# Patient Record
Sex: Male | Born: 1980
Health system: Southern US, Community
[De-identification: ages and names within clinical notes are randomized; demographics above are authoritative.]

## PROBLEM LIST (undated history)

## (undated) DIAGNOSIS — E162 Hypoglycemia, unspecified: Secondary | ICD-10-CM

## (undated) DIAGNOSIS — J45909 Unspecified asthma, uncomplicated: Secondary | ICD-10-CM

## (undated) HISTORY — DX: Unspecified asthma, uncomplicated: J45.909

## (undated) HISTORY — PX: TONSILLECTOMY AND ADENOIDECTOMY: SUR1326

## (undated) HISTORY — PX: WISDOM TOOTH EXTRACTION: SHX21

---

## 2003-10-02 ENCOUNTER — Other Ambulatory Visit: Payer: Self-pay

## 2005-07-27 ENCOUNTER — Ambulatory Visit: Payer: Self-pay | Admitting: Endocrinology

## 2005-07-28 ENCOUNTER — Emergency Department: Payer: Self-pay | Admitting: Emergency Medicine

## 2005-07-29 ENCOUNTER — Ambulatory Visit: Payer: Self-pay | Admitting: Endocrinology

## 2005-07-29 ENCOUNTER — Emergency Department: Payer: Self-pay | Admitting: Internal Medicine

## 2005-07-30 ENCOUNTER — Ambulatory Visit: Payer: Self-pay | Admitting: Emergency Medicine

## 2005-08-05 ENCOUNTER — Emergency Department: Payer: Self-pay | Admitting: Emergency Medicine

## 2005-08-05 ENCOUNTER — Other Ambulatory Visit: Payer: Self-pay

## 2005-08-11 ENCOUNTER — Ambulatory Visit: Payer: Self-pay | Admitting: General Surgery

## 2005-08-21 ENCOUNTER — Ambulatory Visit: Payer: Self-pay | Admitting: Gastroenterology

## 2005-08-26 ENCOUNTER — Ambulatory Visit: Payer: Self-pay | Admitting: Gastroenterology

## 2007-12-12 IMAGING — CR DG CHEST 2V
1 series · 2 of 2 positions shown · non-contrast
Comparison: none

REASON FOR EXAM: rib pain for 2 months
COMMENTS:

PROCEDURE:     DXR - DXR CHEST PA (OR AP) AND LATERAL  - July 27, 2005  [DATE]
RESULT:     The lung fields are clear. No pneumonia, pneumothorax or pleural
effusion is seen. The heart, mediastinal and osseous structures show no
significant abnormalities.

[Series 1: view not recorded · 0.17mm/px · 2 of 2 slices shown]
[im 1/2]
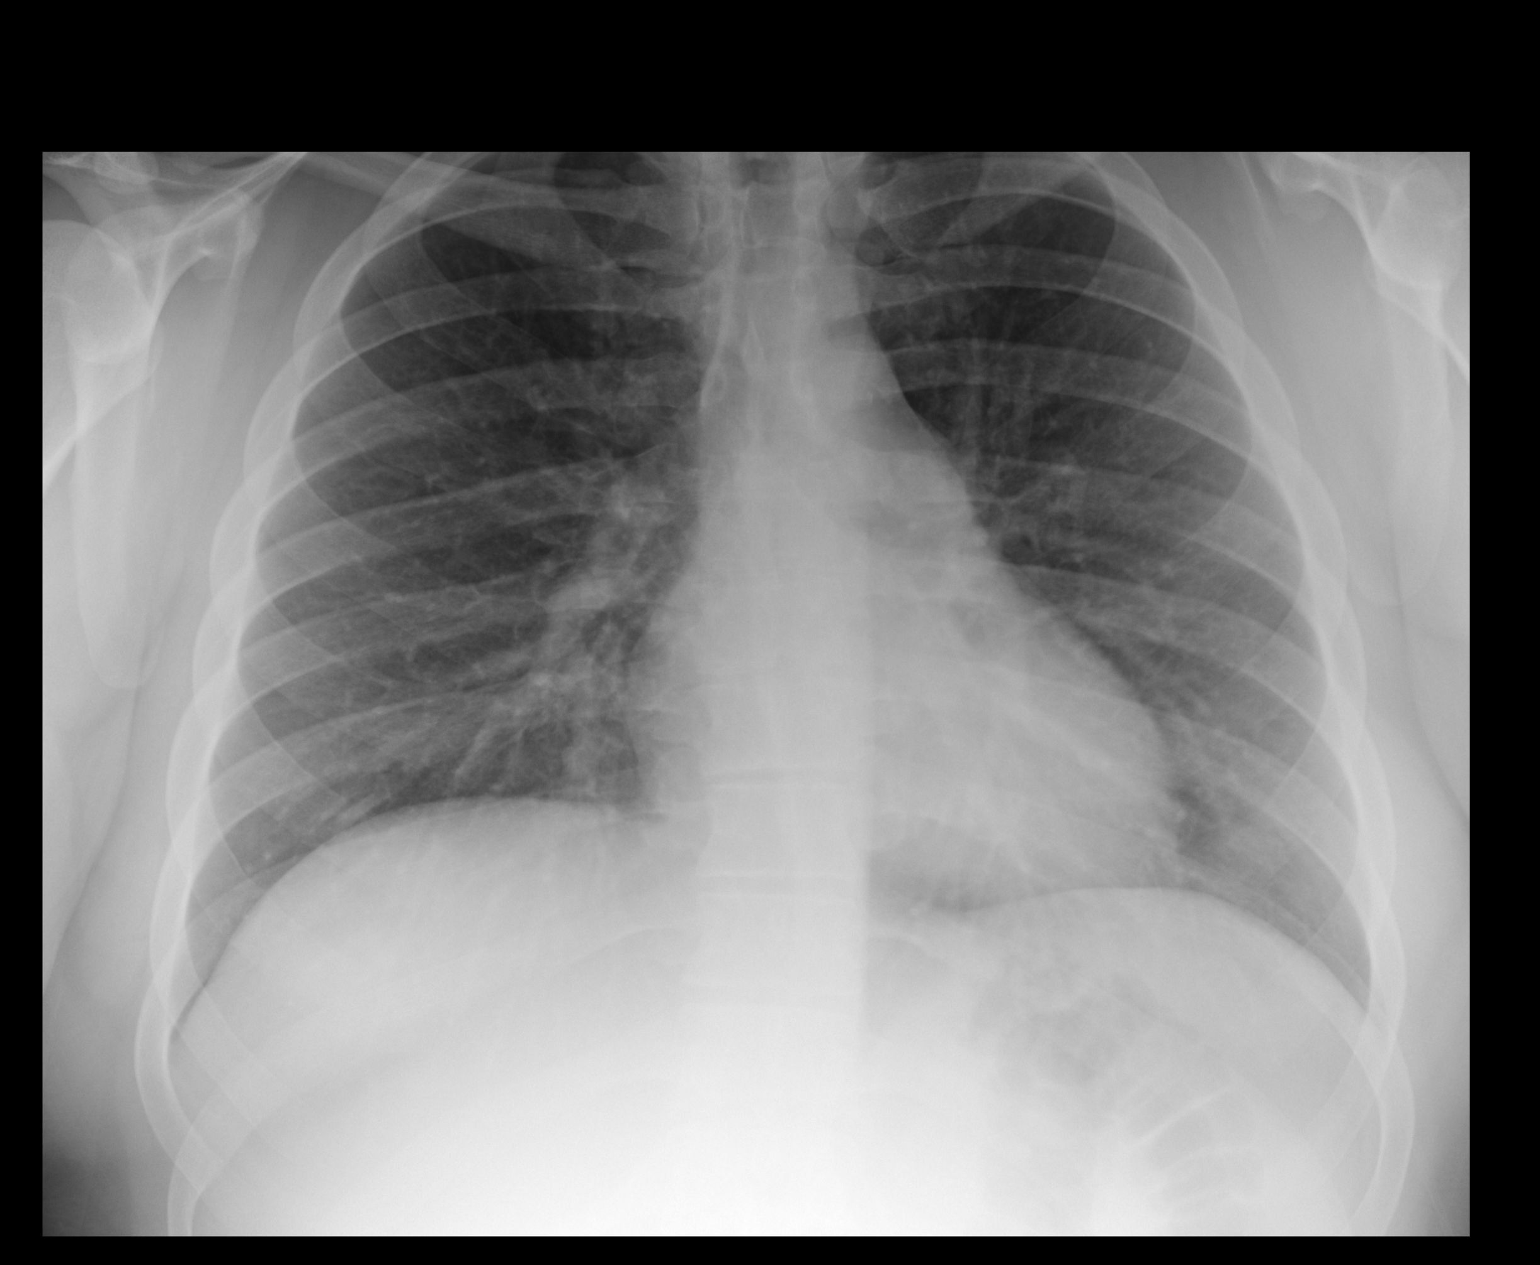
[im 2/2]
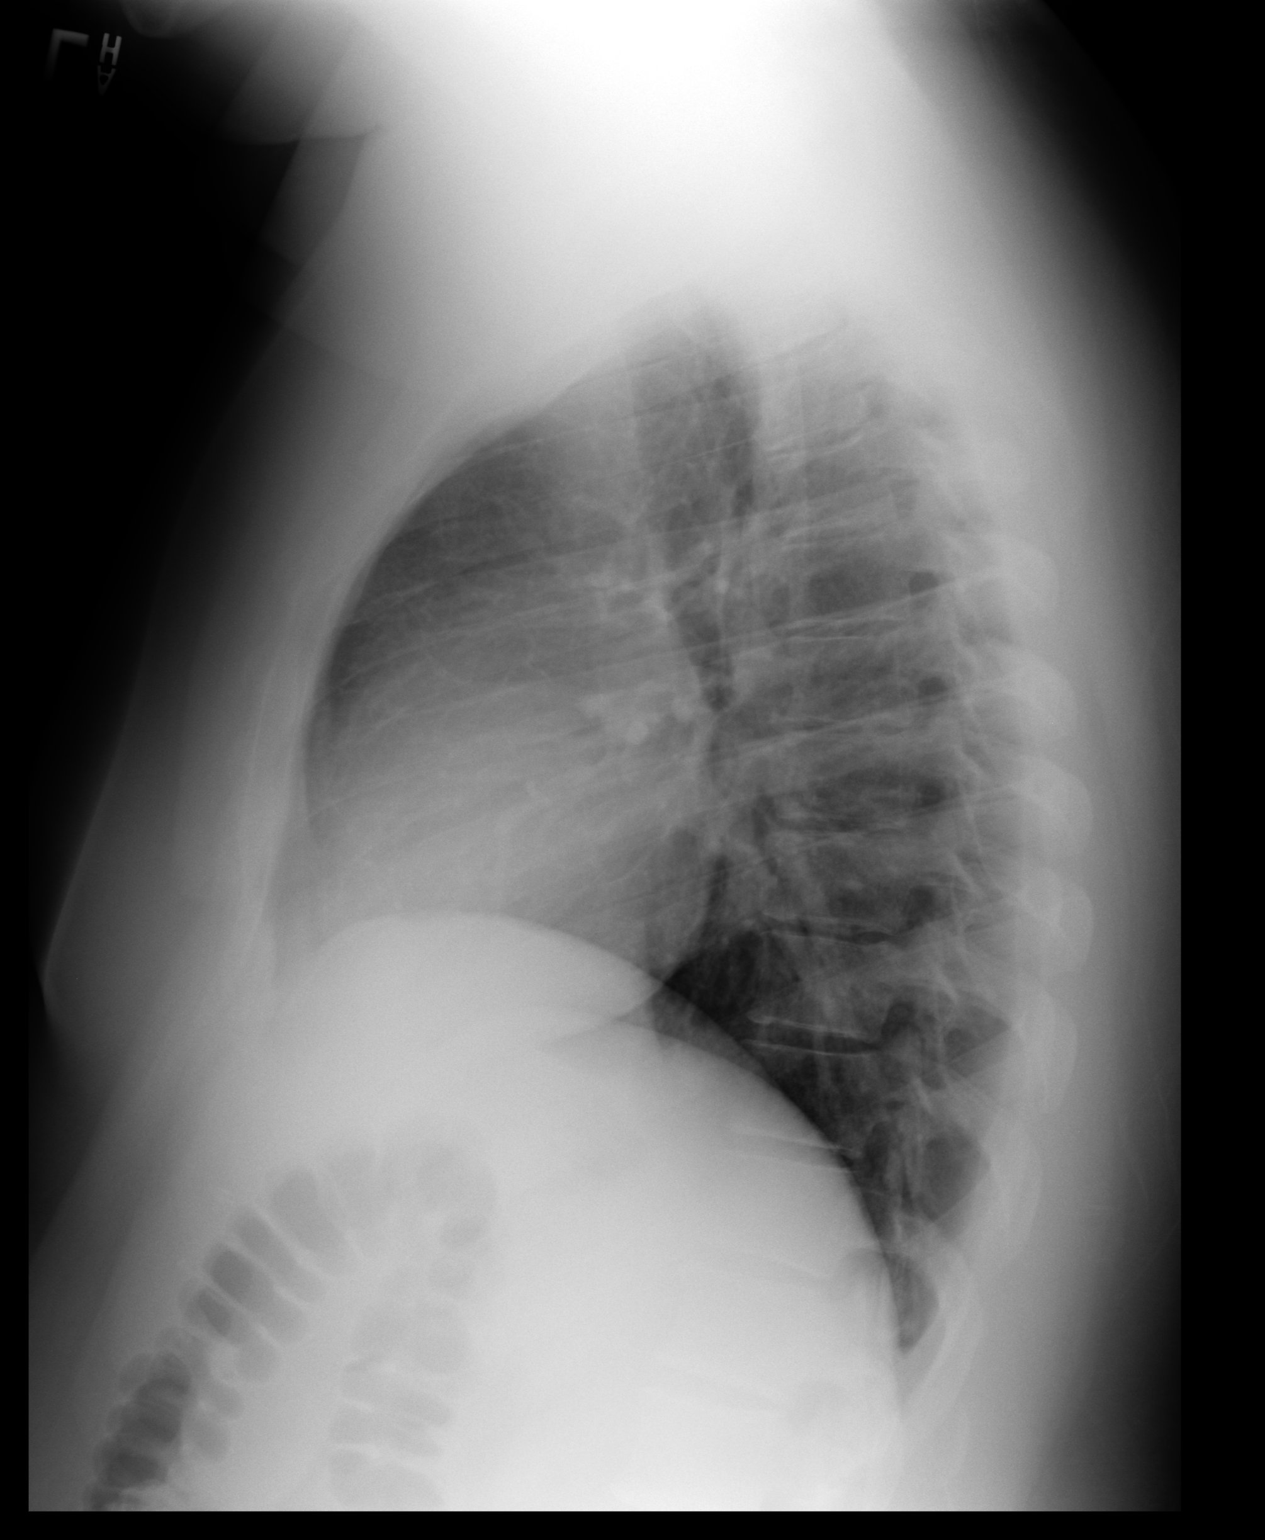

[2 of 2 positions shown; findings below may reference images not displayed]

IMPRESSION: 1)No significant abnormalities are noted.

## 2007-12-14 IMAGING — US ABDOMEN ULTRASOUND
1 series · 14 of 25 positions shown · non-contrast
Comparison: none

REASON FOR EXAM: RUQ pain  Call Report to  [phone]
COMMENTS:

[Series 1: abdomen ultrasound · 0.35mm/px · 14 of 57 slices shown]
[im 1/57]
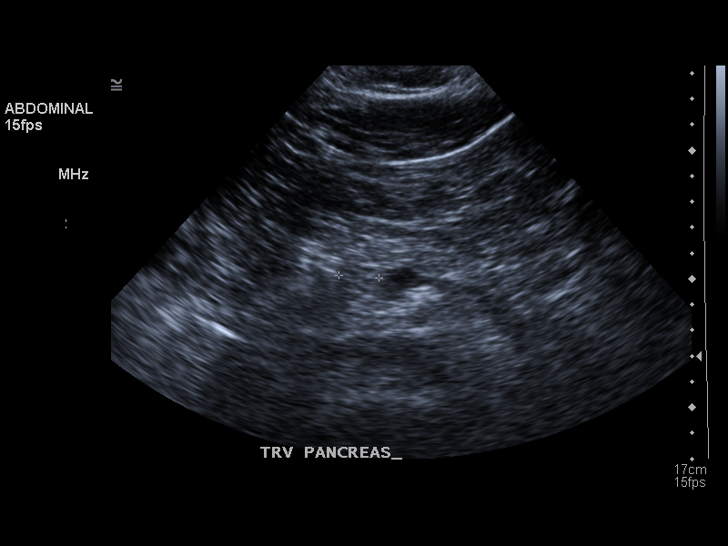
[im 5/57]
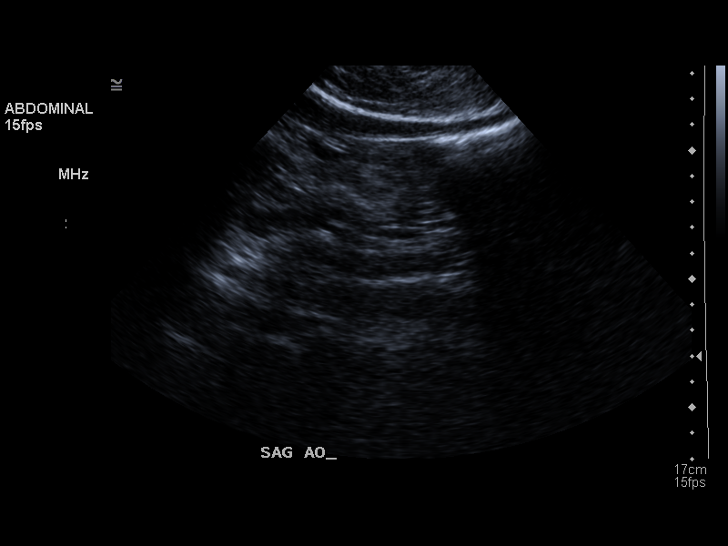
[im 10/57]
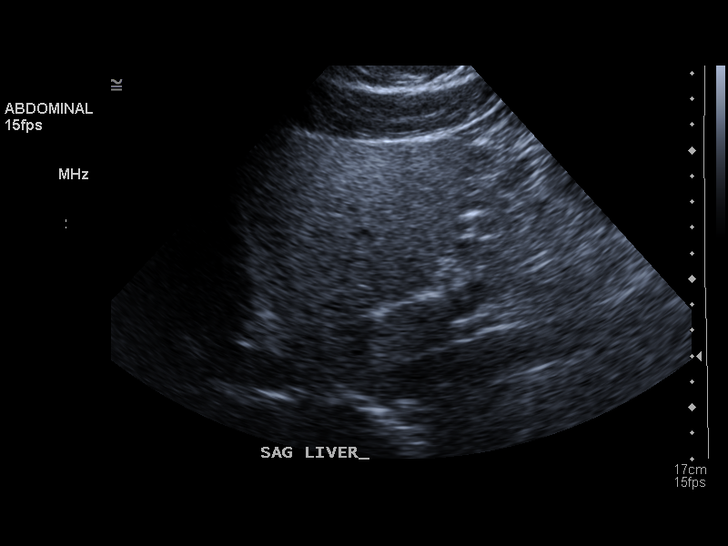
[im 15/57]
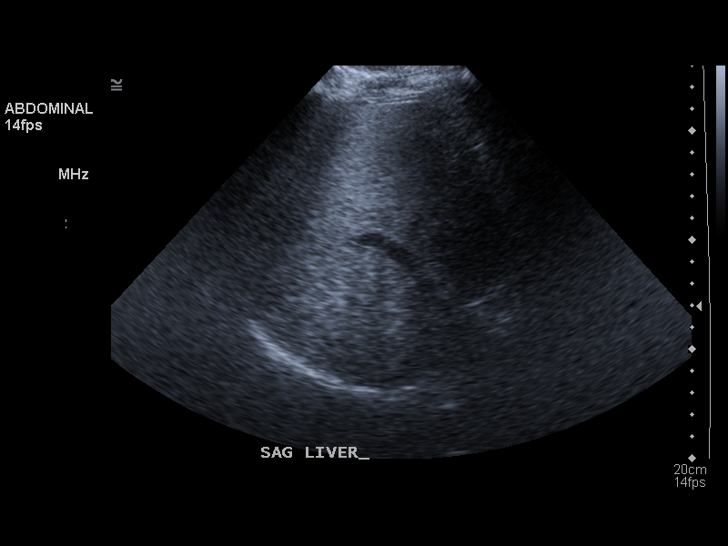
[im 19/57]
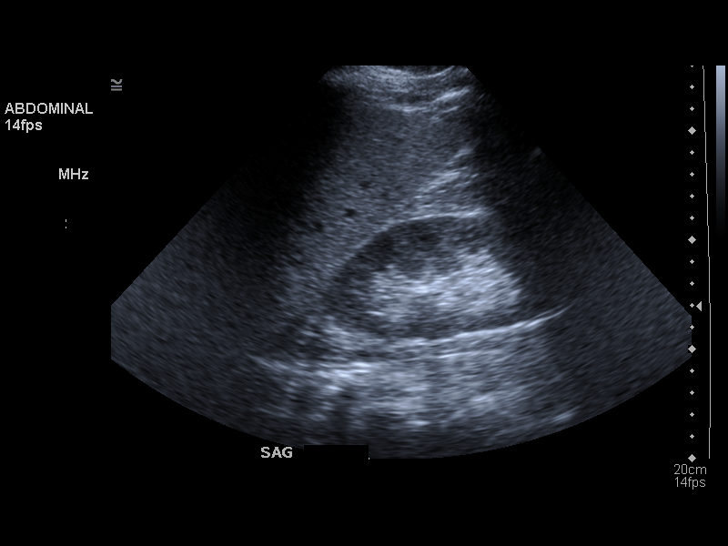
[im 22/57]
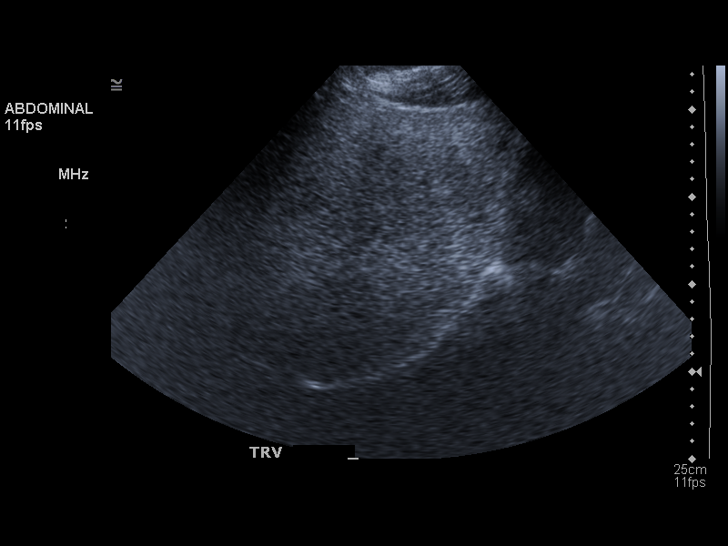
[im 26/57]
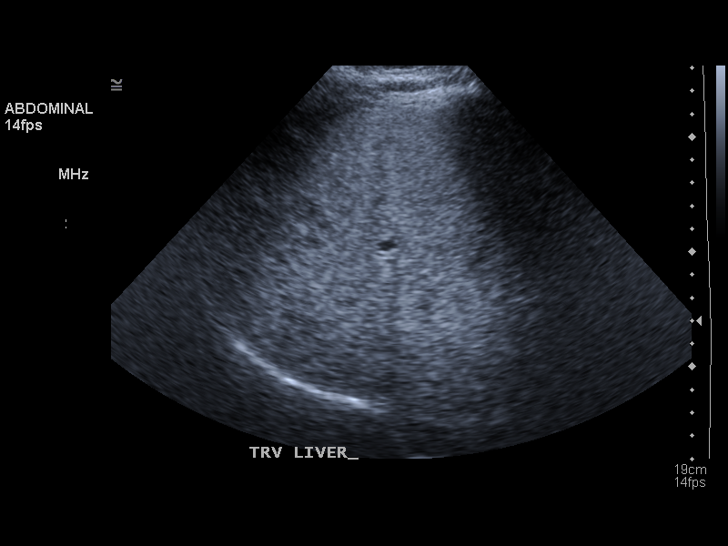
[im 31/57]
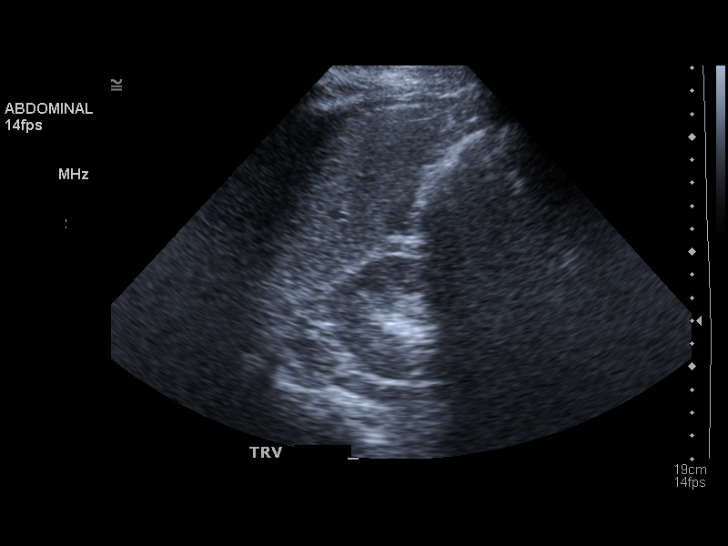
[im 36/57]
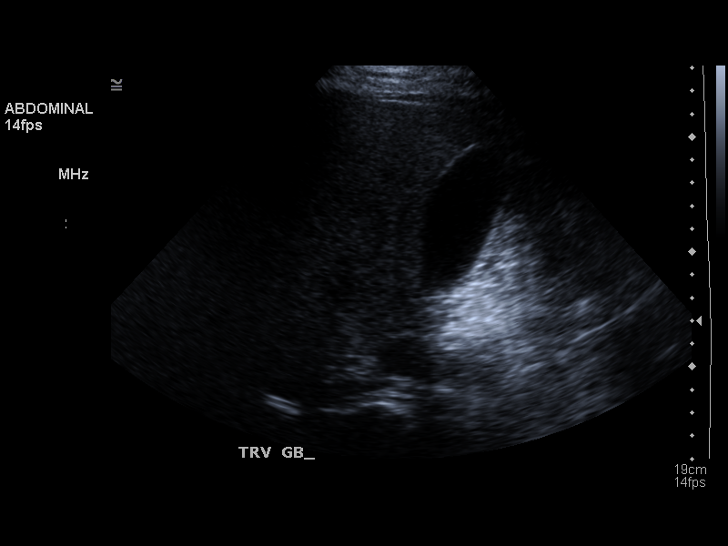
[im 38/57]
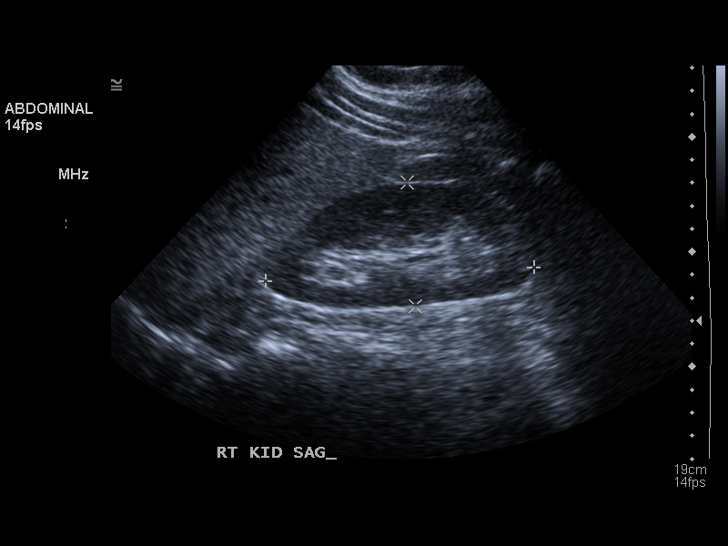
[im 43/57]
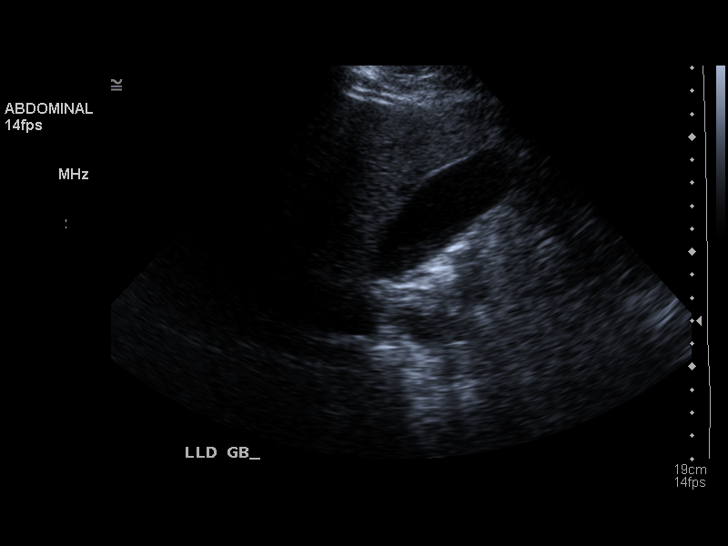
[im 47/57]
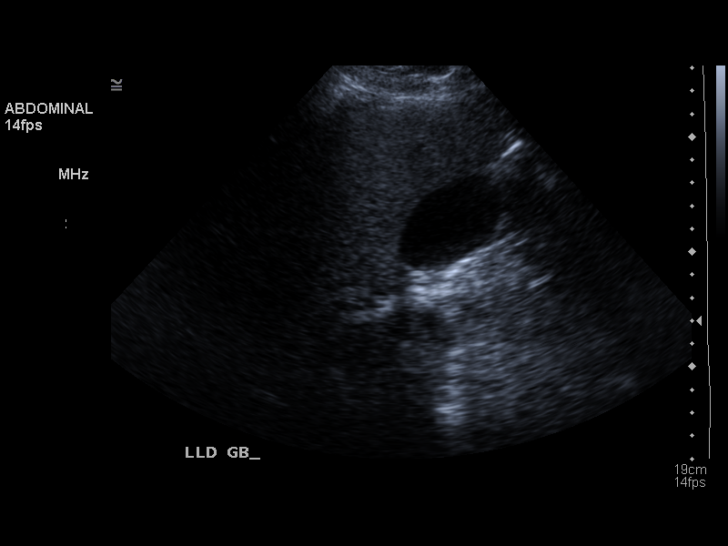
[im 52/57]
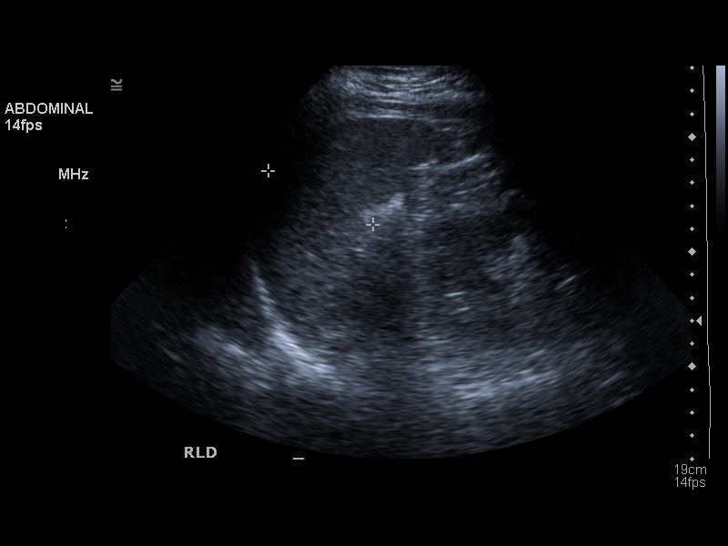
[im 57/57]
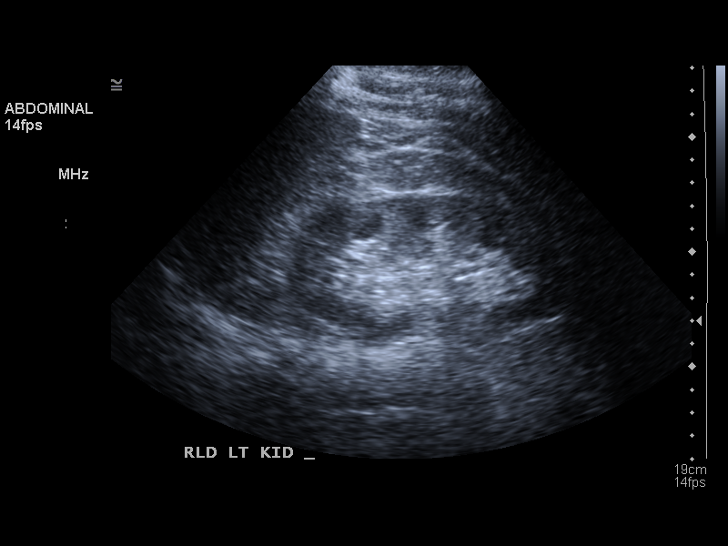

[14 of 25 positions shown; findings below may reference images not displayed]

PROCEDURE:     US  - US ABDOMEN GENERAL SURVEY  - July 29, 2005  [DATE]

RESULT:     The liver, spleen and pancreas show no specific abnormalities.
A note is made that the tail of the pancreas is not well seen on this exam
and is partially obscured by bowel gas.  No gallstones are seen.  There is
no thickening of the gallbladder wall.  Common bile duct measures 2.8 mm in
diameter which is within normal limits.  The kidneys show no hydronephrosis.
 There is no ascites.
IMPRESSION: No significant abnormalities are noted.

The pancreatic tail is not optimally seen on this exam.

## 2007-12-14 IMAGING — CT CT ABD-PELV W/ CM
1 of 2 series · 15 of 32 positions shown, 19 images · non-contrast
Comparison: none

REASON FOR EXAM: (1) abd pain, po and iv contrast//RM7; (2) flank pain, po
and iv contrast
COMMENTS:

[Series 2: abdomen · axial · 0.65mm/px · z∈[-993,-545]mm · 15 of 62 slices shown, 19 images]
[im 3/62  soft-tissue]
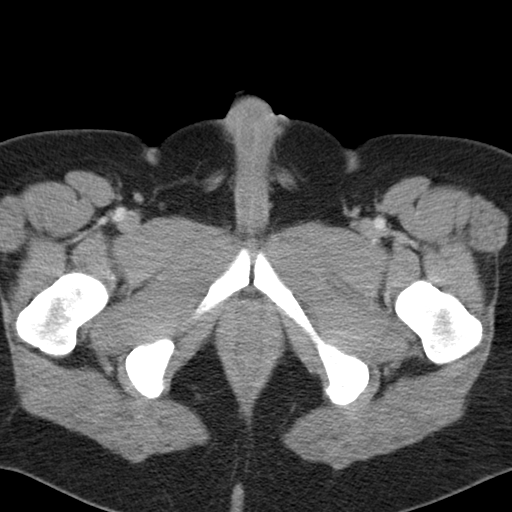
[im 3/62  bone]
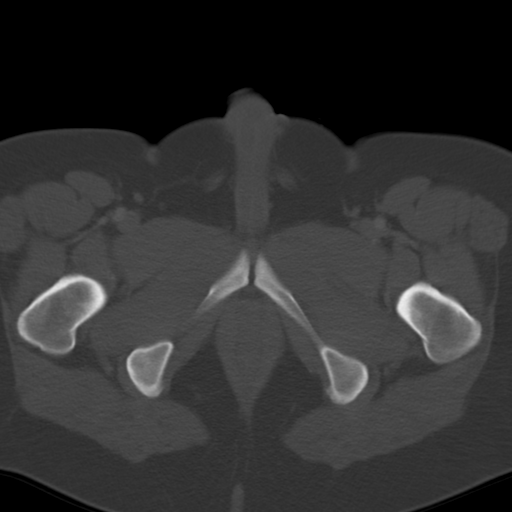
[im 8/62  soft-tissue]
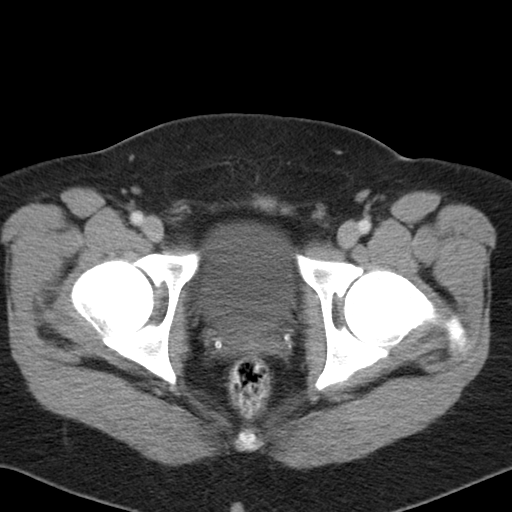
[im 14/62  soft-tissue]
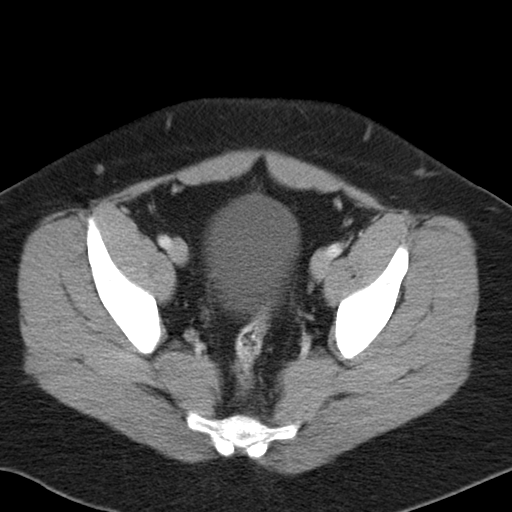
[im 16/62  soft-tissue]
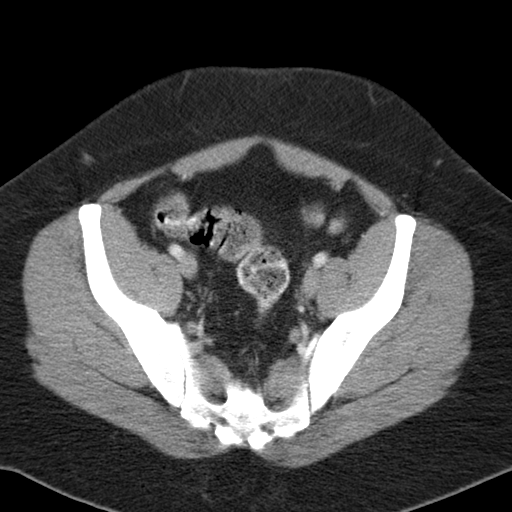
[im 22/62  soft-tissue]
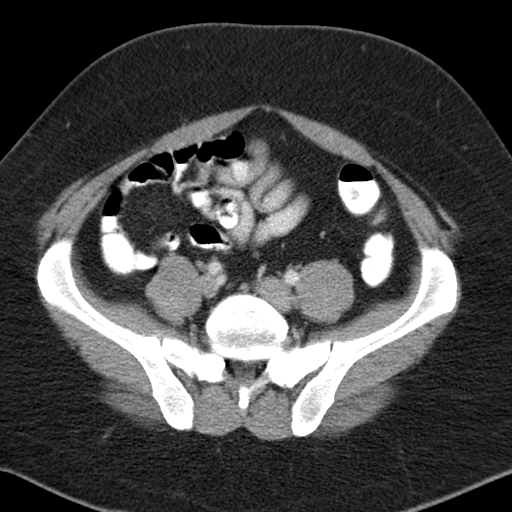
[im 27/62  soft-tissue]
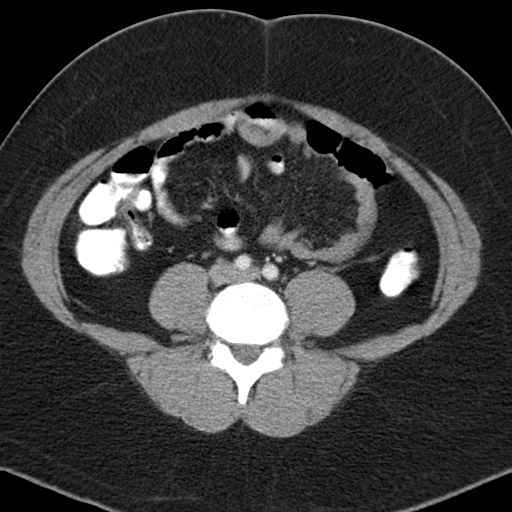
[im 32/62  soft-tissue]
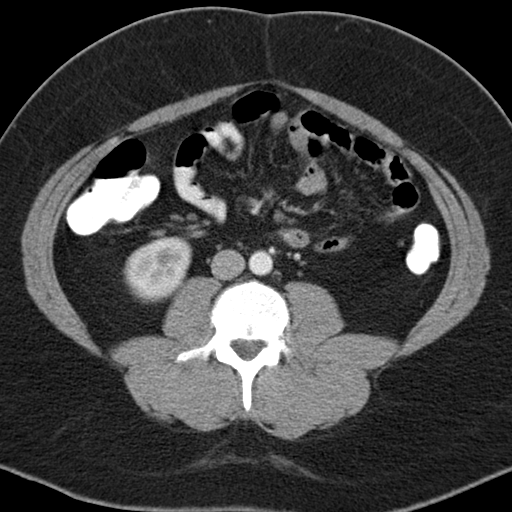
[im 35/62  soft-tissue]
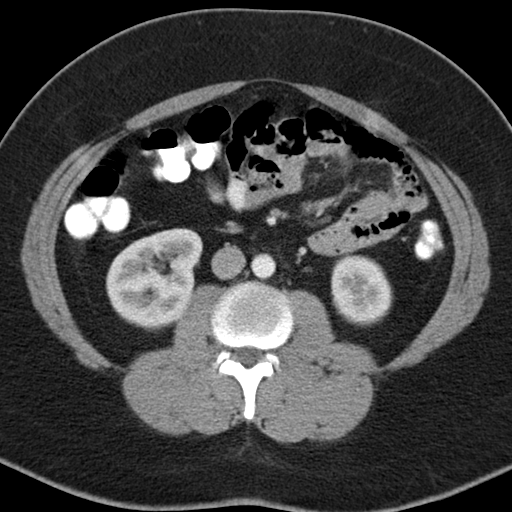
[im 40/62  soft-tissue]
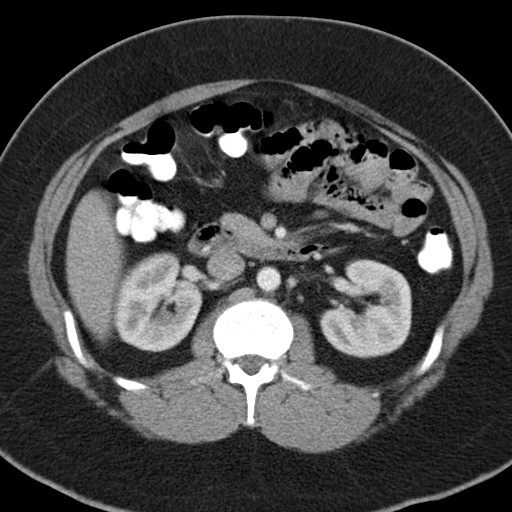
[im 40/62  bone]
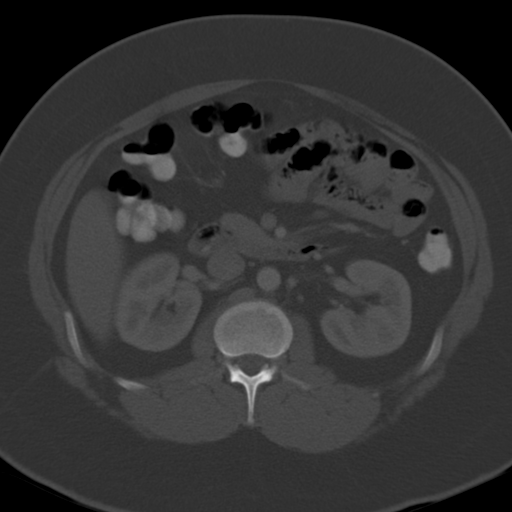
[im 46/62  soft-tissue]
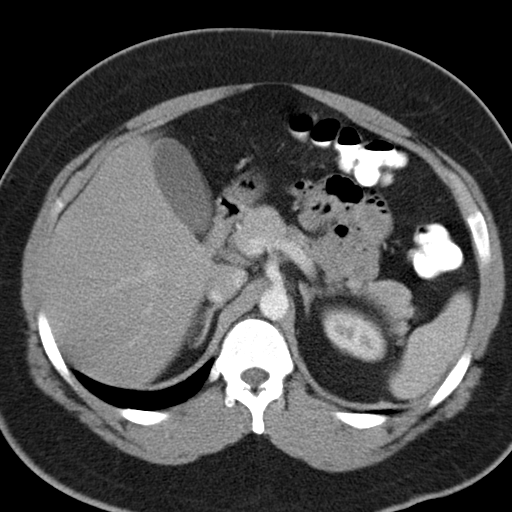
[im 48/62  soft-tissue]
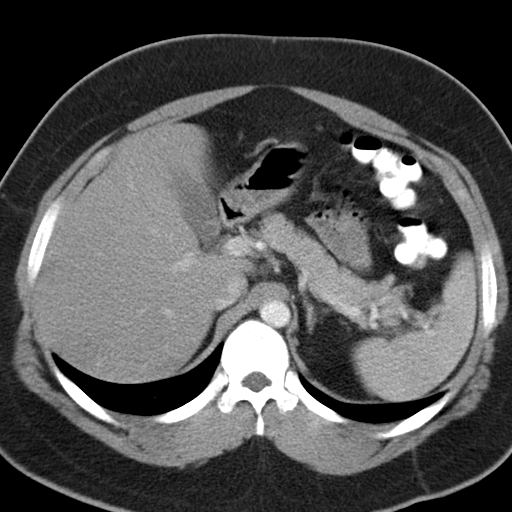
[im 51/62  lung]
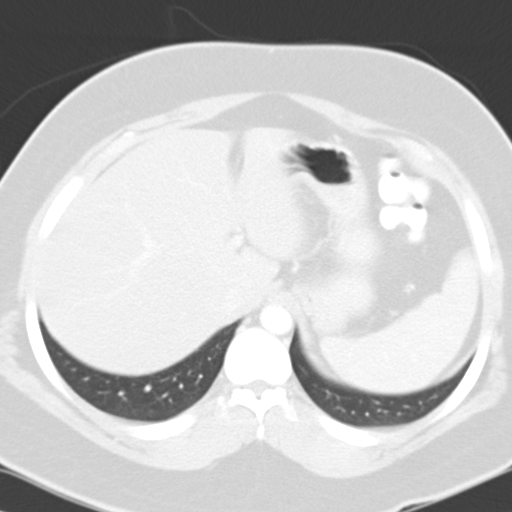
[im 54/62  soft-tissue]
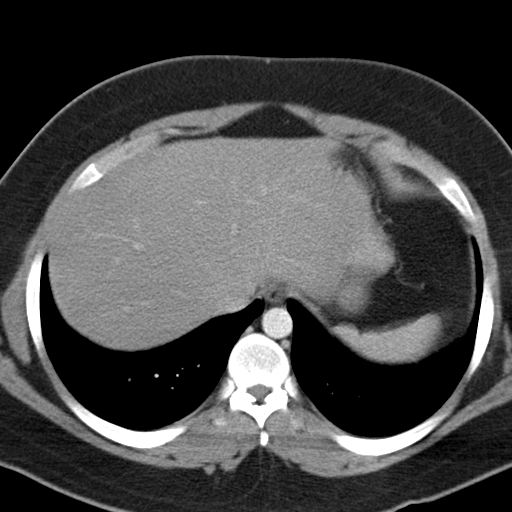
[im 54/62  lung]
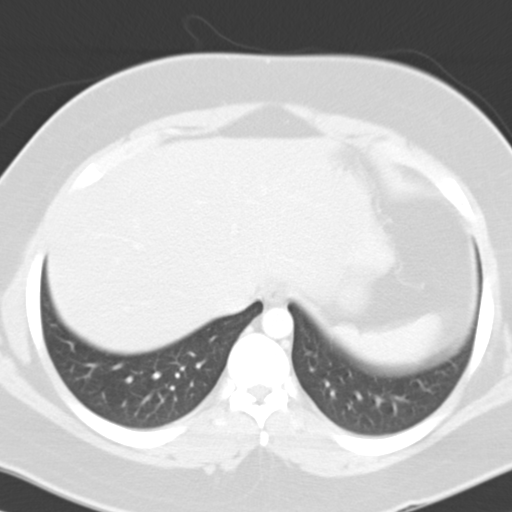
[im 56/62  lung]
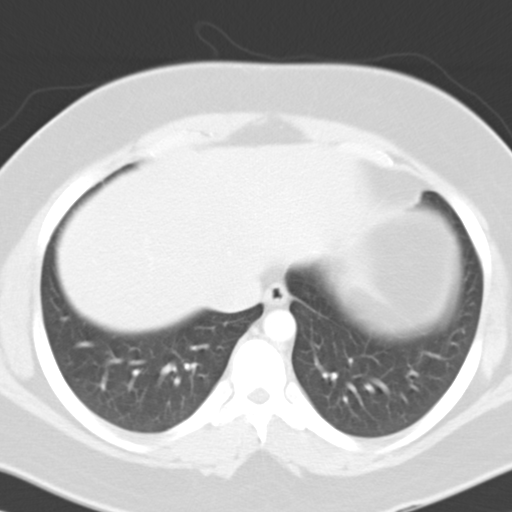
[im 59/62  soft-tissue]
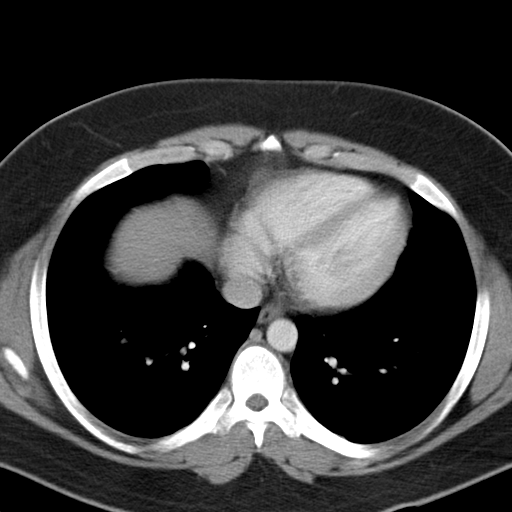
[im 59/62  lung]
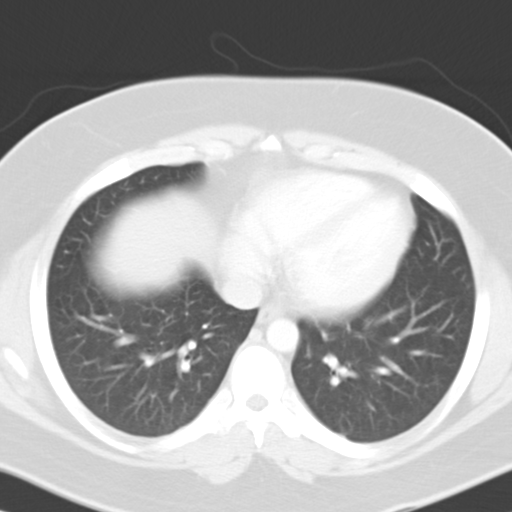

[15 of 32 positions shown; findings below may reference images not displayed]

PROCEDURE:     CT  - CT ABDOMEN / PELVIS  W  - July 29, 2005  [DATE]

RESULT:     Spiral, 8 mm segments were obtained from the lung bases through
the pubic symphysis.  Status post intravenous administration of 100 ml of
Isovue 370 contrast and oral contrast.

Evaluation of the lung bases demonstrates no gross abnormalities.

The liver, spleen, adrenals, pancreas, and kidneys are unremarkable.  There
does not appear to be calcifications within the gallbladder to suggest
calcified gallstones.  No evidence of abdominal or pelvic free fluid,
drainable loculated fluid collections, masses, or adenopathy.  The appendix
is not clearly identified and there does not appear to be CT evidence in the
RIGHT pericolic gutter regions to suggest sequela of appendicitis.  The
celiac, SMA, IMA, and portal vein are patent.
IMPRESSION: 1.     Unremarkable CT of the abdomen and pelvis, as described above.
2.     Dr. Opria of the emergency department was informed of these
findings at the time of the initial interpretation.

## 2008-11-24 ENCOUNTER — Emergency Department: Payer: Self-pay | Admitting: Emergency Medicine

## 2008-12-03 ENCOUNTER — Emergency Department: Payer: Self-pay | Admitting: Internal Medicine

## 2009-08-30 DIAGNOSIS — J45909 Unspecified asthma, uncomplicated: Secondary | ICD-10-CM | POA: Insufficient documentation

## 2009-08-31 ENCOUNTER — Inpatient Hospital Stay: Payer: Self-pay | Admitting: Internal Medicine

## 2009-09-10 ENCOUNTER — Emergency Department: Payer: Self-pay | Admitting: Emergency Medicine

## 2010-11-26 ENCOUNTER — Emergency Department: Payer: Self-pay | Admitting: Emergency Medicine

## 2010-11-27 ENCOUNTER — Emergency Department: Payer: Self-pay | Admitting: Emergency Medicine

## 2010-11-28 ENCOUNTER — Ambulatory Visit: Payer: Self-pay

## 2012-12-12 ENCOUNTER — Other Ambulatory Visit: Payer: Self-pay | Admitting: Orthopedic Surgery

## 2012-12-12 DIAGNOSIS — M25552 Pain in left hip: Secondary | ICD-10-CM

## 2012-12-26 ENCOUNTER — Ambulatory Visit
Admission: RE | Admit: 2012-12-26 | Discharge: 2012-12-26 | Disposition: A | Discharge status: Home / Self Care | Payer: Worker's Compensation | Source: Ambulatory Visit | Attending: Orthopedic Surgery | Admitting: Orthopedic Surgery

## 2012-12-26 DIAGNOSIS — M25552 Pain in left hip: Secondary | ICD-10-CM

## 2012-12-26 MED ORDER — IOHEXOL 180 MG/ML  SOLN: 1.0000 mL | Freq: Once | INTRAMUSCULAR | Status: AC | PRN

## 2014-10-02 ENCOUNTER — Encounter: Payer: Self-pay | Admitting: Family Medicine

## 2014-10-02 ENCOUNTER — Ambulatory Visit (INDEPENDENT_AMBULATORY_CARE_PROVIDER_SITE_OTHER): Payer: BLUE CROSS/BLUE SHIELD | Admitting: Family Medicine

## 2014-10-02 VITALS — BP 118/92 | HR 98 | Temp 99.0°F | Resp 16 | Wt 320.2 lb

## 2014-10-02 DIAGNOSIS — G43109 Migraine with aura, not intractable, without status migrainosus: Secondary | ICD-10-CM | POA: Insufficient documentation

## 2014-10-02 DIAGNOSIS — J019 Acute sinusitis, unspecified: Secondary | ICD-10-CM

## 2014-10-02 DIAGNOSIS — E669 Obesity, unspecified: Secondary | ICD-10-CM | POA: Insufficient documentation

## 2014-10-02 DIAGNOSIS — J309 Allergic rhinitis, unspecified: Secondary | ICD-10-CM | POA: Insufficient documentation

## 2014-10-02 MED ORDER — PREDNISONE 10 MG PO TABS: ORAL_TABLET | ORAL | Status: DC

## 2014-10-02 MED ORDER — DOXYCYCLINE HYCLATE 100 MG PO TABS: 100.0000 mg | ORAL_TABLET | Freq: Two times a day (BID) | ORAL | Status: DC

## 2014-10-02 NOTE — Progress Notes (Signed)
Subjective:     Patient ID: Benjamin Adams, male   DOB: 15-Apr-1980, 34 y.o.   MRN: [id number]  HPI  Chief Complaint  Patient presents with  . Facial Pain    Patient comes in office today with concerns of sinus pain and pressure intermittent over a period of 1-2 weeks. Patient states that pain is mostly located on right side of face, sharp pain starts at right ear radiating down to jaw. Patient descibes sinus pressure as being located above eyes. Associated symptoms include congestion and fatigue, patient has been uring otc Claritin and Tylenol for relief.   States has been using Claritin D with mild improvement. Does not use steroid nasal spray as it upsets his stomach. Accompanied by his wife.  Review of Systems  Constitutional: Negative for fever and chills.  HENT:       No hx of sinus surgery       Objective:   Physical Exam  Constitutional: He appears well-developed and well-nourished. He has a sickly appearance. No distress.  Ears: T.M's intact without inflammation Sinuses: Frontal, maxillary, and paranasal sinuses all mildly tender. Throat: no erythema, tonsils absent. Neck: no cervical adenopathy Lungs: clear     Assessment:    1. Acute sinusitis, recurrence not specified, unspecified location - doxycycline (VIBRA-TABS) 100 MG tablet; Take 1 tablet (100 mg total) by mouth 2 (two) times daily.  Dispense: 20 tablet; Refill: 0 - predniSONE (DELTASONE) 10 MG tablet; Taper daily as follows: 6 pills, 5, 4, 3, 2, 1  Dispense: 21 tablet; Refill: 0    Plan:   Continue Claritin D

## 2014-10-02 NOTE — Patient Instructions (Signed)
Continue Claritin-D.

## 2014-12-19 ENCOUNTER — Emergency Department: Payer: BLUE CROSS/BLUE SHIELD

## 2014-12-19 ENCOUNTER — Emergency Department
Admission: EM | Admit: 2014-12-19 | Discharge: 2014-12-19 | Disposition: A | Discharge status: Home / Self Care | Payer: BLUE CROSS/BLUE SHIELD | Attending: Emergency Medicine | Admitting: Emergency Medicine

## 2014-12-19 ENCOUNTER — Other Ambulatory Visit: Payer: Self-pay

## 2014-12-19 ENCOUNTER — Encounter: Payer: Self-pay | Admitting: Emergency Medicine

## 2014-12-19 DIAGNOSIS — M25512 Pain in left shoulder: Secondary | ICD-10-CM | POA: Diagnosis not present

## 2014-12-19 DIAGNOSIS — M546 Pain in thoracic spine: Secondary | ICD-10-CM | POA: Diagnosis not present

## 2014-12-19 DIAGNOSIS — M25511 Pain in right shoulder: Secondary | ICD-10-CM | POA: Diagnosis not present

## 2014-12-19 DIAGNOSIS — J45901 Unspecified asthma with (acute) exacerbation: Secondary | ICD-10-CM | POA: Diagnosis not present

## 2014-12-19 DIAGNOSIS — Z880 Allergy status to penicillin: Secondary | ICD-10-CM | POA: Diagnosis not present

## 2014-12-19 DIAGNOSIS — R0789 Other chest pain: Secondary | ICD-10-CM | POA: Insufficient documentation

## 2014-12-19 DIAGNOSIS — Z79899 Other long term (current) drug therapy: Secondary | ICD-10-CM | POA: Insufficient documentation

## 2014-12-19 LAB — CBC
HCT: 45.7 % (ref 40.0–52.0)
Hemoglobin: 15.2 g/dL (ref 13.0–18.0)
MCH: 31 pg (ref 26.0–34.0)
MCHC: 33.2 g/dL (ref 32.0–36.0)
MCV: 93.5 fL (ref 80.0–100.0)
PLATELETS: 244 10*3/uL (ref 150–440)
RBC: 4.89 MIL/uL (ref 4.40–5.90)
RDW: 13.9 % (ref 11.5–14.5)
WBC: 8.1 10*3/uL (ref 3.8–10.6)

## 2014-12-19 LAB — COMPREHENSIVE METABOLIC PANEL
ALBUMIN: 4.2 g/dL (ref 3.5–5.0)
ALT: 21 U/L (ref 17–63)
ANION GAP: 8 (ref 5–15)
AST: 22 U/L (ref 15–41)
Alkaline Phosphatase: 97 U/L (ref 38–126)
BUN: 13 mg/dL (ref 6–20)
CHLORIDE: 105 mmol/L (ref 101–111)
CO2: 28 mmol/L (ref 22–32)
Calcium: 9.3 mg/dL (ref 8.9–10.3)
Creatinine, Ser: 0.85 mg/dL (ref 0.61–1.24)
GFR calc Af Amer: >60 mL/min (ref >60)
GFR calc non Af Amer: >60 mL/min (ref >60)
GLUCOSE: 107 mg/dL — AB (ref 65–99)
POTASSIUM: 4 mmol/L (ref 3.5–5.1)
Sodium: 141 mmol/L (ref 135–145)
Total Bilirubin: 0.6 mg/dL (ref 0.3–1.2)
Total Protein: 8.3 g/dL — ABNORMAL HIGH (ref 6.5–8.1)

## 2014-12-19 LAB — TROPONIN I: Troponin I: 0.03 ng/mL (ref <0.031)

## 2014-12-19 LAB — FIBRIN DERIVATIVES D-DIMER (ARMC ONLY): FIBRIN DERIVATIVES D-DIMER (ARMC): 513 — AB (ref 0–499)

## 2014-12-19 MED ORDER — MORPHINE SULFATE (PF) 4 MG/ML IV SOLN: 4.0000 mg | Freq: Once | INTRAVENOUS | Status: DC

## 2014-12-19 MED ORDER — IBUPROFEN 800 MG PO TABS
800.0000 mg | ORAL_TABLET | Freq: Three times a day (TID) | ORAL | Status: DC | PRN
Start: 2014-12-19 — End: 2016-10-14

## 2014-12-19 MED ORDER — HYDROCODONE-ACETAMINOPHEN 5-325 MG PO TABS: 1.0000 | ORAL_TABLET | Freq: Four times a day (QID) | ORAL | Status: DC | PRN

## 2014-12-19 MED ORDER — FENTANYL CITRATE (PF) 100 MCG/2ML IJ SOLN
25.0000 ug | Freq: Once | INTRAMUSCULAR | Status: AC
  Administered 2014-12-19: 25 ug via INTRAVENOUS
  Filled 2014-12-19: qty 2

## 2014-12-19 MED ORDER — DIAZEPAM 5 MG/ML IJ SOLN
5.0000 mg | Freq: Once | INTRAMUSCULAR | Status: AC
  Administered 2014-12-19: 5 mg via INTRAVENOUS
  Filled 2014-12-19: qty 2

## 2014-12-19 MED ORDER — CYCLOBENZAPRINE HCL 5 MG PO TABS: 5.0000 mg | ORAL_TABLET | Freq: Three times a day (TID) | ORAL | Status: DC | PRN

## 2014-12-19 MED ORDER — SODIUM CHLORIDE 0.9 % IV BOLUS (SEPSIS)
1000.0000 mL | Freq: Once | INTRAVENOUS | Status: AC
  Administered 2014-12-19: 1000 mL via INTRAVENOUS

## 2014-12-19 MED ORDER — IOHEXOL 350 MG/ML SOLN
125.0000 mL | Freq: Once | INTRAVENOUS | Status: AC | PRN
  Administered 2014-12-19: 125 mL via INTRAVENOUS

## 2014-12-19 NOTE — ED Provider Notes (Addendum)
CSN: [id number]     Arrival date & time 12/19/14  7829 History   First MD Initiated Contact with Patient 12/19/14 0809     Chief Complaint  Patient presents with  . Chest Pain     (Consider location/radiation/quality/duration/timing/severity/associated sxs/prior Treatment) The history is provided by the patient.  GANESH DEEG is a 34 y.o. male hx of asthma here with chest pain, back pain. He has acute onset of back pain started this morning. States that it is in his upper back and felt like somebody stabbing him in the back. It radiated to the front of his chest afterwards. Associated with some shortness of breath as well as headaches. He started to get ready for work but was unable to go because of the pain. States that pain has improved right now. Denies any family history of CAD and no personal history of CAD.    Past Medical History  Diagnosis Date  . Asthma    Past Surgical History  Procedure Laterality Date  . Tonsillectomy and adenoidectomy     Family History  Problem Relation Age of Onset  . Diabetes Mother   . Hyperlipidemia Mother   . Hypertension Mother   . Hyperlipidemia Father   . Diabetes Father   . Cholecystitis Father   . Healthy Brother    Social History  Substance Use Topics  . Smoking status: Never Smoker   . Smokeless tobacco: Never Used  . Alcohol Use: 0.0 oz/week    0 Standard drinks or equivalent per week     Comment: OCCASIONALLY    Review of Systems  Cardiovascular: Positive for chest pain.  All other systems reviewed and are negative.     Allergies  Albuterol; Azithromycin; Cephalexin; Ciprofloxacin; Clarithromycin; Codeine; Erythromycin; Fluocinolone; Nitrofurantoin monohyd macro; Penicillins; Qvar ; and Sulfa antibiotics  Home Medications   Prior to Admission medications   Medication Sig Start Date End Date Taking? Authorizing Provider  loratadine-pseudoephedrine (CLARITIN-D 24 HOUR) 10-240 MG per 24 hr tablet Take 1 tablet by mouth  daily.   Yes Historical Provider, MD   BP 111/72 mmHg  Pulse 71  Temp(Src) 98.6 F (37 C) (Oral)  Resp 16  Ht  (1.753 m)  Wt 330 lb (149.687 kg)  BMI 48.71 kg/m2  SpO2 99% Physical Exam  Constitutional: He is oriented to person, place, and time.  Uncomfortable   HENT:  Head: Normocephalic.  Eyes: Conjunctivae are normal. Pupils are equal, round, and reactive to light.  Neck: Normal range of motion. Neck supple.  Cardiovascular: Normal rate, regular rhythm and normal heart sounds.   Pulmonary/Chest: Effort normal and breath sounds normal. No respiratory distress. He has no wheezes. He has no rales.  Reproducible bilateral parathoracic tenderness and mild bilateral scapula tenderness   Abdominal: Bowel sounds are normal. He exhibits no distension. There is no tenderness. There is no rebound.  Musculoskeletal: Normal range of motion. He exhibits no edema or tenderness.  Neurological: He is alert and oriented to person, place, and time.  Good lower extremity pulses   Skin: Skin is warm and dry.  Psychiatric: He has a normal mood and affect. His behavior is normal. Thought content normal.  Nursing note and vitals reviewed.   ED Course  Procedures (including critical care time) Labs Review Labs Reviewed  COMPREHENSIVE METABOLIC PANEL - Abnormal; Notable for the following:    Glucose, Bld 107 (*)    Total Protein 8.3 (*)    All other components within normal limits  FIBRIN DERIVATIVES D-DIMER ([facility] ONLY) - Abnormal; Notable for the following:    Fibrin derivatives D-dimer [facility]) 513 (*)    All other components within normal limits  CBC  TROPONIN I  TROPONIN I    Imaging Review Dg Chest 2 View  12/19/2014  CLINICAL DATA:  34 year old male with right-sided chest pain radiating to the anterior chest wall and shortness of breath EXAM: CHEST  2 VIEW COMPARISON:  Prior chest x-ray 11/26/2010 FINDINGS: The lungs are clear and negative for focal airspace consolidation, pulmonary  edema or suspicious pulmonary nodule. No pleural effusion or pneumothorax. Cardiac and mediastinal contours are within normal limits. No acute fracture or lytic or blastic osseous lesions. The visualized upper abdominal bowel gas pattern is unremarkable. IMPRESSION: Negative chest x-ray. Electronically Signed   By: Malachy Moan M.D.   On: 12/19/2014 07:40   Ct Angio Chest Aorta W/cm &/or Wo/cm  12/19/2014  CLINICAL DATA:  Thoracic pain extending into lower back for 3.5 hours. Evaluate for dissection. EXAM: CT ANGIOGRAPHY CHEST, ABDOMEN AND PELVIS TECHNIQUE: Multidetector CT imaging through the chest, abdomen and pelvis was performed using the standard protocol during bolus administration of intravenous contrast. Multiplanar reconstructed images and MIPs were obtained and reviewed to evaluate the vascular anatomy. CONTRAST:  OMNIPAQUE IOHEXOL 350 MG/ML SOLN COMPARISON:  Plain film of 12/19/2014 FINDINGS: CT CHEST FINDINGS Mediastinum/Nodes: Normal caliber of the great vessels. Bovine arch. Normal caliber of the thoracic aorta, without dissection. Borderline cardiomegaly, without pericardial effusion. No central pulmonary embolism, on this non-dedicated study. No mediastinal or hilar adenopathy. Lungs/Pleura: No pleural fluid.  Clear lungs. Musculoskeletal: No acute osseous abnormality. CT ABDOMEN PELVIS FINDINGS Hepatobiliary: Mild hepatic steatosis with mild hepatomegaly, 18 cm craniocaudal. No focal liver lesion. Normal gallbladder, without biliary ductal dilatation. Pancreas: Normal, without mass or ductal dilatation. Spleen: Normal in size, without focal abnormality. Adrenals/Urinary Tract: Normal adrenal glands. Normal kidneys, without hydronephrosis. Normal urinary bladder. Stomach/Bowel: Normal stomach, without wall thickening. Normal colon, appendix, and terminal ileum. Normal small bowel. Vascular/Lymphatic: Normal caliber of the aorta and branch vessels. Multiple renal arteries bilaterally. A  circumaortic left renal vein. No evidence of aortic dissection. Normal appearance of the mesenteric arteries. Pelvic vasculature is unremarkable. No abdominopelvic adenopathy. Reproductive: Normal prostate. Other: No significant free fluid. Musculoskeletal: Thoracolumbar spondylosis. Review of the MIP images confirms the above findings. IMPRESSION: 1. No evidence of aortic dissection or other explanation for patient's symptoms. 2. Hepatic steatosis and mild hepatomegaly. Electronically Signed   By: Jeronimo Greaves M.D.   On: 12/19/2014 10:09   Ct Cta Abd/pel W/cm &/or W/o Cm  12/19/2014  CLINICAL DATA:  Thoracic pain extending into lower back for 3.5 hours. Evaluate for dissection. EXAM: CT ANGIOGRAPHY CHEST, ABDOMEN AND PELVIS TECHNIQUE: Multidetector CT imaging through the chest, abdomen and pelvis was performed using the standard protocol during bolus administration of intravenous contrast. Multiplanar reconstructed images and MIPs were obtained and reviewed to evaluate the vascular anatomy. CONTRAST:  OMNIPAQUE IOHEXOL 350 MG/ML SOLN COMPARISON:  Plain film of 12/19/2014 FINDINGS: CT CHEST FINDINGS Mediastinum/Nodes: Normal caliber of the great vessels. Bovine arch. Normal caliber of the thoracic aorta, without dissection. Borderline cardiomegaly, without pericardial effusion. No central pulmonary embolism, on this non-dedicated study. No mediastinal or hilar adenopathy. Lungs/Pleura: No pleural fluid.  Clear lungs. Musculoskeletal: No acute osseous abnormality. CT ABDOMEN PELVIS FINDINGS Hepatobiliary: Mild hepatic steatosis with mild hepatomegaly, 18 cm craniocaudal. No focal liver lesion. Normal gallbladder, without biliary ductal dilatation. Pancreas: Normal, without mass or  ductal dilatation. Spleen: Normal in size, without focal abnormality. Adrenals/Urinary Tract: Normal adrenal glands. Normal kidneys, without hydronephrosis. Normal urinary bladder. Stomach/Bowel: Normal stomach, without wall  thickening. Normal colon, appendix, and terminal ileum. Normal small bowel. Vascular/Lymphatic: Normal caliber of the aorta and branch vessels. Multiple renal arteries bilaterally. A circumaortic left renal vein. No evidence of aortic dissection. Normal appearance of the mesenteric arteries. Pelvic vasculature is unremarkable. No abdominopelvic adenopathy. Reproductive: Normal prostate. Other: No significant free fluid. Musculoskeletal: Thoracolumbar spondylosis. Review of the MIP images confirms the above findings. IMPRESSION: 1. No evidence of aortic dissection or other explanation for patient's symptoms. 2. Hepatic steatosis and mild hepatomegaly. Electronically Signed   By: Jeronimo GreavesKyle  Talbot M.D.   On: 12/19/2014 10:09   I have personally reviewed and evaluated these images and lab results as part of my medical decision-making.   EKG Interpretation None       ED ECG REPORT I, Darneshia Demary, the attending physician, personally viewed and interpreted this ECG.   Date: 12/19/2014  EKG Time: 6:54 AM  Rate: 107  Rhythm: sinus tachycardia  Axis: normal  Intervals:none  ST&T Change: borderline short PR    MDM   Final diagnoses:  None    Wilber OliphantRobert C Andreatta is a 34 y.o. male here with scapula pain, chest pain, shortness of breath. Mildly tachy and hypertensive on arrival. Consider muscle strain vs dissection. No PE risk factors. Will get d-dimer, if neg will not do dissection study.   9:20 AM D-dimer positive. Pain improved with pain meds. Will get dissection study.   12:27 PM CT showed no dissection or obvious large PE. Not tachy or hypoxic. Has reproducible tenderness parathoracic area. Pain controlled with meds now. I think likely muscle strain. Delta neg. Will dc home with vicodin, flexeril, motrin.     Richardean Canalavid H Adit Riddles, MD 12/19/14 1228  Richardean Canalavid H Adrien Shankar, MD 12/19/14 [phone]

## 2014-12-19 NOTE — Discharge Instructions (Signed)
Take motrin for pain.  Take flexeril for spasms.   Take vicodin for severe pain.   You are likely going to have pain for several days.   No heavy lifting for 2-3 days.   See your doctor,   Return to ER if you have worse pain, spasms, trouble breathing.

## 2014-12-19 NOTE — ED Notes (Signed)
MD Yao at bedside 

## 2014-12-19 NOTE — ED Notes (Addendum)
Pt presents to the ER from work with complains of chest pain that radiates to shoulder blades "stabbing sensation" pt reports he was standing waiting to start work when symptoms presented. Pt denies any nausea, vomiting, denies any other medical symptom, pt talks in complete sentences no respiratory distress noted.

## 2014-12-20 ENCOUNTER — Encounter: Payer: Self-pay | Admitting: Family Medicine

## 2014-12-20 ENCOUNTER — Ambulatory Visit (INDEPENDENT_AMBULATORY_CARE_PROVIDER_SITE_OTHER): Payer: BLUE CROSS/BLUE SHIELD | Admitting: Family Medicine

## 2014-12-20 VITALS — BP 106/74 | HR 96 | Temp 98.5°F | Resp 16 | Wt 311.4 lb

## 2014-12-20 DIAGNOSIS — M62830 Muscle spasm of back: Secondary | ICD-10-CM

## 2014-12-20 DIAGNOSIS — R0789 Other chest pain: Secondary | ICD-10-CM

## 2014-12-20 NOTE — Progress Notes (Signed)
Patient ID: Benjamin Adams, male   DOB: [birth year], 34 y.o.   MRN: [id number]  Follow Up ER Visit  Patient is here for ER follow up.  He was recently seen at [facility] for chest pain on 12/19/2014. Treatment for this included Ibuprofen, Hydrocodone-Acetaminophen and Flexeril  He reports poor compliance with treatment. He reports this condition is Improved. EKG, CXR and CT scan with contrast were all normal without evidence of cardiopulmonary disease and no PE. Final diagnosis was chest wall muscle spasm. ------------------------------------------------------------------------------------ Patient Active Problem List   Diagnosis Date Noted  . Migraine with aura 10/02/2014  . Allergic rhinitis 10/02/2014  . Adiposity 10/02/2014  . Asthma 08/30/2009   Past Surgical History  Procedure Laterality Date  . Tonsillectomy and adenoidectomy     Family History  Problem Relation Age of Onset  . Diabetes Mother   . Hyperlipidemia Mother   . Hypertension Mother   . Hyperlipidemia Father   . Diabetes Father   . Cholecystitis Father   . Healthy Brother    Social History  Substance Use Topics  . Smoking status: Never Smoker   . Smokeless tobacco: Never Used  . Alcohol Use: 0.0 oz/week    0 Standard drinks or equivalent per week     Comment: OCCASIONALLY   Current Outpatient Prescriptions on File Prior to Visit  Medication Sig Dispense Refill  . loratadine-pseudoephedrine (CLARITIN-D 24 HOUR) 10-240 MG per 24 hr tablet Take 1 tablet by mouth daily.    . cyclobenzaprine (FLEXERIL) 5 MG tablet Take 1 tablet (5 mg total) by mouth every 8 (eight) hours as needed for muscle spasms. (Patient not taking: Reported on 12/20/2014) 15 tablet 0  . HYDROcodone-acetaminophen (NORCO/VICODIN) 5-325 MG tablet Take 1 tablet by mouth every 6 (six) hours as needed for moderate pain. (Patient not taking: Reported on 12/20/2014) 10 tablet 0  . ibuprofen (ADVIL,MOTRIN) 800 MG tablet Take 1 tablet (800 mg total) by mouth  every 8 (eight) hours as needed. (Patient not taking: Reported on 12/20/2014) 30 tablet 0   No current facility-administered medications on file prior to visit.   Allergies  Allergen Reactions  . Albuterol   . Azithromycin   . Cephalexin     Other reaction(s): Vomiting  . Ciprofloxacin     makes skin feel like it is burning  . Clarithromycin Swelling  . Codeine     Other reaction(s): Stomach Ache, Unconsciousness  . Erythromycin   . Fluocinolone Itching  . Nitrofurantoin Monohyd Macro Itching  . Penicillins   . Qvar  [Beclomethasone]     QVAR causes wheezing  . Sulfa Antibiotics     rash   Review of Systems  Constitutional: Negative.   HENT: Negative.   Eyes: Negative.   Respiratory: Negative.   Cardiovascular: Negative.   Gastrointestinal: Negative.   Musculoskeletal: Negative.   Skin: Negative.   Psychiatric/Behavioral: Negative.    BP 106/74 mmHg  Pulse 96  Temp(Src) 98.5 F (36.9 C) (Oral)  Resp 16  Wt 311 lb 6.4 oz (141.25 kg)  SpO2 96%   Physical Exam  Constitutional: He appears well-developed and well-nourished.  HENT:  Head: Normocephalic.  Eyes: EOM are normal.  Neck: Neck supple.  Cardiovascular: Normal rate, regular rhythm, normal heart sounds and intact distal pulses.   Pulmonary/Chest: Effort normal and breath sounds normal.  Abdominal: Soft. Bowel sounds are normal.  Neurological: He is alert. He has normal reflexes.  Psychiatric: He has a normal mood and affect. His behavior is normal.  Benjamin MaduroRobert was seen today for hospitalization follow-up.  Diagnoses and all orders for this visit:  1.Chest wall pain Sudden sharp pain in middle of left upper back with radiation to chest started on 12-19-14. Has a history of asthma and diagnosed with acute chest and back muscle spasm in the ER the same day. EKG, CXR and CT scan with contrast were negative for cardiac or pulmonary etiology. Treated with Norco, Ibuprofen and Cyclobenzaprine. Feeling better  today.  2.Muscle spasm of back Onset 12-19-14. No longer having pain and no dyspnea or diaphoresis. May continue present medications and applications of moist heat. Recheck if any recurrence.

## 2015-02-19 ENCOUNTER — Other Ambulatory Visit: Payer: Self-pay

## 2015-02-19 ENCOUNTER — Encounter: Payer: Self-pay | Admitting: Family Medicine

## 2015-02-19 ENCOUNTER — Ambulatory Visit (INDEPENDENT_AMBULATORY_CARE_PROVIDER_SITE_OTHER): Payer: BLUE CROSS/BLUE SHIELD | Admitting: Family Medicine

## 2015-02-19 VITALS — BP 110/70 | HR 89 | Temp 97.7°F | Resp 18 | Wt 320.4 lb

## 2015-02-19 DIAGNOSIS — J320 Chronic maxillary sinusitis: Secondary | ICD-10-CM | POA: Diagnosis not present

## 2015-02-19 MED ORDER — AMOXICILLIN 875 MG PO TABS: 875.0000 mg | ORAL_TABLET | Freq: Two times a day (BID) | ORAL | Status: DC

## 2015-02-19 NOTE — Patient Instructions (Signed)

## 2015-02-19 NOTE — Progress Notes (Signed)
Patient ID: Benjamin Adams, male   DOB: [birth year], 35 y.o.   MRN: [id number]   Patient: Benjamin Adams Male    DOB: [birth year]   35 y.o.   MRN: [id number] Visit Date: 02/19/2015  Today's Provider: Dortha Kern, PA   Chief Complaint  Patient presents with  . URI   Subjective:    URI  This is a new problem. The current episode started in the past 7 days. Associated symptoms include congestion, coughing, ear pain, sinus pain and sneezing. He has tried decongestant and acetaminophen (Mucinex-D and some Doxycycline) for the symptoms. The treatment provided mild relief.  Some sore throat and post nasal drip without fever over the past 7 days.  Patient Active Problem List   Diagnosis Date Noted  . Migraine with aura 10/02/2014  . Allergic rhinitis 10/02/2014  . Adiposity 10/02/2014  . Asthma 08/30/2009   Past Surgical History  Procedure Laterality Date  . Tonsillectomy and adenoidectomy     Family History  Problem Relation Age of Onset  . Diabetes Mother   . Hyperlipidemia Mother   . Hypertension Mother   . Hyperlipidemia Father   . Diabetes Father   . Cholecystitis Father   . Healthy Brother    Allergies  Allergen Reactions  . Albuterol   . Azithromycin   . Cephalexin     Other reaction(s): Vomiting  . Ciprofloxacin     makes skin feel like it is burning  . Clarithromycin Swelling  . Codeine     Other reaction(s): Stomach Ache, Unconsciousness  . Erythromycin   . Fluocinolone Itching  . Nitrofurantoin Monohyd Macro Itching  . Penicillins   . Qvar  [Beclomethasone]     QVAR causes wheezing  . Sulfa Antibiotics     rash     Previous Medications   ACETAMINOPHEN (TYLENOL) 500 MG TABLET    Take by mouth.   IBUPROFEN (ADVIL,MOTRIN) 800 MG TABLET    Take 1 tablet (800 mg total) by mouth every 8 (eight) hours as needed.   LORATADINE-PSEUDOEPHEDRINE (CLARITIN-D 24 HOUR) 10-240 MG PER 24 HR TABLET    Take 1 tablet by mouth daily.    Review of Systems  Constitutional:  Negative.   HENT: Positive for congestion, ear pain and sneezing.   Eyes: Negative.   Respiratory: Positive for cough.   Cardiovascular: Negative.   Gastrointestinal: Negative.   Endocrine: Negative.   Genitourinary: Negative.   Musculoskeletal: Negative.   Skin: Negative.   Allergic/Immunologic: Negative.   Neurological: Negative.   Hematological: Negative.   Psychiatric/Behavioral: Negative.     Social History  Substance Use Topics  . Smoking status: Never Smoker   . Smokeless tobacco: Never Used  . Alcohol Use: 0.0 oz/week    0 Standard drinks or equivalent per week     Comment: OCCASIONALLY   Objective:   BP 110/70 mmHg  Pulse 89  Temp(Src) 97.7 F (36.5 C) (Oral)  Resp 18  Wt 320 lb 6.4 oz (145.332 kg)  SpO2 98%  Physical Exam  Constitutional: He is oriented to person, place, and time. He appears well-developed and well-nourished.  HENT:  Head: Normocephalic.  Right Ear: External ear normal.  Left Ear: External ear normal.  Cobblestone appearance in posterior pharynx and reddened left turbinates. Tender and no transillumination through the left maxillary sinus.  Eyes: Conjunctivae and EOM are normal.  Neck: Normal range of motion. Neck supple.  Cardiovascular: Normal rate and regular rhythm.   Pulmonary/Chest: Effort normal and breath sounds  normal.  Abdominal: Soft. Bowel sounds are normal.  Lymphadenopathy:    He has no cervical adenopathy.  Neurological: He is alert and oriented to person, place, and time.  Psychiatric: He has a normal mood and affect. His behavior is normal.      Assessment & Plan:     1. Left maxillary sinusitis Onset with cough and congestion over the past 7 days. Progressed to sinus pain but no fever yet. May use Mucinex-D, Delsym and/or Jerilynn Somessalon Perles he has at home. Increase fluid intake and may use Tylenol or Advil prn headache/sinus pain. Add antibiotic and recheck in 5 days if no better. Note for out of work today and tomorrow  to get good blood level of antibiotic and less in contagiousness. - amoxicillin (AMOXIL) 875 MG tablet; Take 1 tablet (875 mg total) by mouth 2 (two) times daily.  Dispense: 20 tablet; Refill: 0

## 2015-03-07 ENCOUNTER — Ambulatory Visit (INDEPENDENT_AMBULATORY_CARE_PROVIDER_SITE_OTHER): Payer: BLUE CROSS/BLUE SHIELD | Admitting: Family Medicine

## 2015-03-07 ENCOUNTER — Encounter: Payer: Self-pay | Admitting: Family Medicine

## 2015-03-07 VITALS — BP 122/74 | HR 74 | Temp 98.2°F | Resp 18 | Wt 315.2 lb

## 2015-03-07 DIAGNOSIS — A084 Viral intestinal infection, unspecified: Secondary | ICD-10-CM

## 2015-03-07 MED ORDER — PROMETHAZINE HCL 25 MG PO TABS: 25.0000 mg | ORAL_TABLET | Freq: Four times a day (QID) | ORAL | Status: DC | PRN

## 2015-03-07 NOTE — Patient Instructions (Signed)

## 2015-03-07 NOTE — Progress Notes (Signed)
Patient ID: Benjamin Adams, male   DOB: 03/15/1980, 35 y.o.   MRN: [id number]   Patient: Benjamin Adams Male    DOB: 1980-04-06   35 y.o.   MRN: [id number] Visit Date: 03/07/2015  Today's Provider: Dortha Kern, PA   Chief Complaint  Patient presents with  . Emesis  . Diarrhea   Subjective:    Emesis  This is a new problem. The current episode started yesterday. The problem occurs 5 to 10 times per day. Associated symptoms include chills and diarrhea. Associated symptoms comments: Fatigue, body aches .  Diarrhea  The current episode started yesterday. The problem occurs 5 to 10 times per day. Associated symptoms include chills and vomiting.   Patient Active Problem List   Diagnosis Date Noted  . Migraine with aura 10/02/2014  . Allergic rhinitis 10/02/2014  . Adiposity 10/02/2014  . Asthma 08/30/2009   Past Surgical History  Procedure Laterality Date  . Tonsillectomy and adenoidectomy     Family History  Problem Relation Age of Onset  . Diabetes Mother   . Hyperlipidemia Mother   . Hypertension Mother   . Hyperlipidemia Father   . Diabetes Father   . Cholecystitis Father   . Healthy Brother      Previous Medications   ACETAMINOPHEN (TYLENOL) 500 MG TABLET    Take by mouth.   IBUPROFEN (ADVIL,MOTRIN) 800 MG TABLET    Take 1 tablet (800 mg total) by mouth every 8 (eight) hours as needed.   LORATADINE-PSEUDOEPHEDRINE (CLARITIN-D 24 HOUR) 10-240 MG PER 24 HR TABLET    Take 1 tablet by mouth daily.   Allergies  Allergen Reactions  . Albuterol   . Azithromycin   . Cephalexin     Other reaction(s): Vomiting  . Ciprofloxacin     makes skin feel like it is burning  . Clarithromycin Swelling  . Codeine     Other reaction(s): Stomach Ache, Unconsciousness  . Erythromycin   . Fluocinolone Itching  . Nitrofurantoin Monohyd Macro Itching  . Qvar  [Beclomethasone]     QVAR causes wheezing  . Sulfa Antibiotics     rash    Review of Systems  Constitutional: Positive  for chills and fatigue.  HENT: Negative.   Eyes: Negative.   Respiratory: Negative.   Cardiovascular: Negative.   Gastrointestinal: Positive for vomiting and diarrhea.  Endocrine: Negative.   Genitourinary: Negative.   Musculoskeletal: Negative.   Skin: Negative.   Allergic/Immunologic: Negative.   Neurological: Negative.   Hematological: Negative.   Psychiatric/Behavioral: Negative.     Social History  Substance Use Topics  . Smoking status: Never Smoker   . Smokeless tobacco: Never Used  . Alcohol Use: 0.0 oz/week    0 Standard drinks or equivalent per week     Comment: OCCASIONALLY   Objective:   BP 122/74 mmHg  Pulse 74  Temp(Src) 98.2 F (36.8 C) (Oral)  Resp 18  Wt 315 lb 3.2 oz (142.974 kg)  Physical Exam  Constitutional: He is oriented to person, place, and time. He appears well-developed and well-nourished.  HENT:  Head: Normocephalic.  Eyes: Conjunctivae and EOM are normal.  Neck: Neck supple.  Cardiovascular: Normal rate and regular rhythm.   Pulmonary/Chest: Effort normal and breath sounds normal.  Abdominal: Soft.  Slightly generalized hyperactive bowel sounds with minimal soreness.   Neurological: He is alert and oriented to person, place, and time.  Skin: Skin is warm and dry.      Assessment & Plan:  1. Viral gastroenteritis Onset yesterday with general malaise and progressed to nausea, vomiting, diarrhea, fatigue and chills last night. Recommend increase fluid intake and bland/liquid diet. Slowly progress diet back to regular as stomach allows. May use Imodium-AD for diarrhea and given Promethazine for nausea and vomiting. May use Tylenol or Advil prn headache or chills. Out of work 03-07-15 to 03-10-15. Recheck if no better by Monday. - promethazine (PHENERGAN) 25 MG tablet; Take 1 tablet (25 mg total) by mouth every 6 (six) hours as needed for nausea or vomiting.  Dispense: 20 tablet; Refill: 0

## 2015-06-26 ENCOUNTER — Ambulatory Visit (INDEPENDENT_AMBULATORY_CARE_PROVIDER_SITE_OTHER): Payer: BLUE CROSS/BLUE SHIELD | Admitting: Family Medicine

## 2015-06-26 ENCOUNTER — Encounter: Payer: Self-pay | Admitting: Family Medicine

## 2015-06-26 VITALS — BP 114/72 | HR 86 | Temp 98.5°F | Resp 18 | Wt 324.6 lb

## 2015-06-26 DIAGNOSIS — J0100 Acute maxillary sinusitis, unspecified: Secondary | ICD-10-CM

## 2015-06-26 MED ORDER — PREDNISONE 10 MG PO TABS: ORAL_TABLET | ORAL | Status: DC

## 2015-06-26 MED ORDER — DOXYCYCLINE HYCLATE 100 MG PO TABS: 100.0000 mg | ORAL_TABLET | Freq: Two times a day (BID) | ORAL | Status: DC

## 2015-06-26 NOTE — Progress Notes (Signed)
Subjective:     Patient ID: Benjamin OliphantRobert C Zimmers, male   DOB: 12-17-1980, 35 y.o.   MRN: [id number]  HPI  Chief Complaint  Patient presents with  . Sinus Problem    Patient comes in office today with concerns of facial pain, ear pain and congestion for the past week. He states this morning he woke up with dizziness. Patient reports that he has been taking otc Claritin D and Tylenol for relief.   Reports sub-optimal control of his allergies for a few weeks. Patient reports increased sinus pressure, purulent sinus drainage, post nasal drainage and accompanying cough. States he does not tolerate steroid nasal sprays.   Review of Systems     Objective:   Physical Exam  Constitutional: He appears well-developed and well-nourished. He has a sickly appearance.  Ears: T.M's intact without inflammation Sinuses: mild frontal and maxillary sinus tenderness. Throat: tonsils absent Neck: no cervical adenopathy Lungs: clear     Assessment:    1. Acute maxillary sinusitis, recurrence not specified - doxycycline (VIBRA-TABS) 100 MG tablet; Take 1 tablet (100 mg total) by mouth 2 (two) times daily.  Dispense: 20 tablet; Refill: 0 - predniSONE (DELTASONE) 10 MG tablet; Taper daily as follows: 6 pills, 5, 4, 3, 2, 1  Dispense: 21 tablet; Refill: 0    Plan:    Continue Claritin D.

## 2015-06-26 NOTE — Patient Instructions (Signed)
Continue Claritin-D.

## 2015-09-24 DIAGNOSIS — J0100 Acute maxillary sinusitis, unspecified: Secondary | ICD-10-CM | POA: Diagnosis not present

## 2015-09-24 DIAGNOSIS — G43019 Migraine without aura, intractable, without status migrainosus: Secondary | ICD-10-CM | POA: Diagnosis not present

## 2015-09-26 ENCOUNTER — Emergency Department: Payer: BLUE CROSS/BLUE SHIELD

## 2015-09-26 ENCOUNTER — Emergency Department
Admission: EM | Admit: 2015-09-26 | Discharge: 2015-09-26 | Disposition: A | Discharge status: Home / Self Care | Payer: BLUE CROSS/BLUE SHIELD | Attending: Student in an Organized Health Care Education/Training Program | Admitting: Student in an Organized Health Care Education/Training Program

## 2015-09-26 ENCOUNTER — Encounter: Payer: Self-pay | Admitting: Emergency Medicine

## 2015-09-26 DIAGNOSIS — R51 Headache: Secondary | ICD-10-CM

## 2015-09-26 DIAGNOSIS — J0110 Acute frontal sinusitis, unspecified: Secondary | ICD-10-CM

## 2015-09-26 DIAGNOSIS — R519 Headache, unspecified: Secondary | ICD-10-CM

## 2015-09-26 DIAGNOSIS — J45909 Unspecified asthma, uncomplicated: Secondary | ICD-10-CM | POA: Insufficient documentation

## 2015-09-26 HISTORY — DX: Hypoglycemia, unspecified: E16.2

## 2015-09-26 LAB — BASIC METABOLIC PANEL
Anion gap: 5 (ref 5–15)
BUN: 15 mg/dL (ref 6–20)
CO2: 25 mmol/L (ref 22–32)
CREATININE: 0.72 mg/dL (ref 0.61–1.24)
Calcium: 8.7 mg/dL — ABNORMAL LOW (ref 8.9–10.3)
Chloride: 110 mmol/L (ref 101–111)
GFR calc Af Amer: >60 mL/min (ref >60)
Glucose, Bld: 89 mg/dL (ref 65–99)
Potassium: 4.2 mmol/L (ref 3.5–5.1)
SODIUM: 140 mmol/L (ref 135–145)

## 2015-09-26 MED ORDER — DIPHENHYDRAMINE HCL 50 MG/ML IJ SOLN
25.0000 mg | Freq: Once | INTRAMUSCULAR | Status: AC
  Administered 2015-09-26: 25 mg via INTRAVENOUS
  Filled 2015-09-26: qty 1

## 2015-09-26 MED ORDER — PROCHLORPERAZINE EDISYLATE 5 MG/ML IJ SOLN
10.0000 mg | Freq: Once | INTRAMUSCULAR | Status: AC
  Administered 2015-09-26: 10 mg via INTRAVENOUS
  Filled 2015-09-26: qty 2

## 2015-09-26 MED ORDER — SODIUM CHLORIDE 0.9 % IV BOLUS (SEPSIS)
1000.0000 mL | Freq: Once | INTRAVENOUS | Status: AC
  Administered 2015-09-26: 1000 mL via INTRAVENOUS

## 2015-09-26 MED ORDER — IOPAMIDOL (ISOVUE-300) INJECTION 61%
75.0000 mL | Freq: Once | INTRAVENOUS | Status: AC | PRN
  Administered 2015-09-26: 75 mL via INTRAVENOUS

## 2015-09-26 MED ORDER — ACETAMINOPHEN 500 MG PO TABS: 1000.0000 mg | ORAL_TABLET | Freq: Four times a day (QID) | ORAL | Status: DC | PRN

## 2015-09-26 NOTE — ED Notes (Signed)
Pt returned from CT, resting in chair, family at bedside

## 2015-09-26 NOTE — ED Provider Notes (Signed)
[facility] Emergency Department Provider Note    First MD Initiated Contact with Patient 09/26/15 [phone]     (approximate)  I have reviewed the triage vital signs and the nursing notes.   HISTORY  Chief Complaint Dizziness and Migraine    HPI Benjamin Adams is a 35 y.o. male recent diagnosis of sinusitis as well as a history of chronic migraines presents for worsening headache and sinus pressure despite being on doxycycline for 3 days. Patient was seen in his primary care physician's office 3 days ago diagnosed with sinusitis. States that since then he's had a gradually worsening headache that is similar in character to his previous migraines but has never had a headache this severe. States that the pain is located behind both of his eyes. Denies any neck pain. No visual disturbances. Has had nausea with vomiting due to the pain. He initially went to his primary care doctor today to be evaluated as he was unable to stay at work due to the worsening pain and he was directed to the ER for further evaluation.   Past Medical History:  Diagnosis Date  . Asthma   . Hypoglycemia     Patient Active Problem List   Diagnosis Date Noted  . Migraine with aura 10/02/2014  . Allergic rhinitis 10/02/2014  . Adiposity 10/02/2014  . Asthma 08/30/2009    Past Surgical History:  Procedure Laterality Date  . TONSILLECTOMY AND ADENOIDECTOMY      Prior to Admission medications   Medication Sig Start Date End Date Taking? Authorizing Provider  acetaminophen (TYLENOL) 500 MG tablet Take by mouth.    Historical Provider, MD  doxycycline (VIBRA-TABS) 100 MG tablet Take 1 tablet (100 mg total) by mouth 2 (two) times daily. 06/26/15   Anola Gurneyobert Chauvin, PA  ibuprofen (ADVIL,MOTRIN) 800 MG tablet Take 1 tablet (800 mg total) by mouth every 8 (eight) hours as needed. 12/19/14   Charlynne Panderavid Hsienta Yao, MD  loratadine-pseudoephedrine (CLARITIN-D 24 HOUR) 10-240 MG per 24 hr tablet Take 1  tablet by mouth daily.    Historical Provider, MD  predniSONE (DELTASONE) 10 MG tablet Taper daily as follows: 6 pills, 5, 4, 3, 2, 1 06/26/15   Anola Gurneyobert Chauvin, PA    Allergies Albuterol; Azithromycin; Cephalexin; Ciprofloxacin; Clarithromycin; Clavulanic acid; Codeine; Erythromycin; Fluocinolone; Nitrofurantoin monohyd macro; Qvar  [beclomethasone]; and Sulfa antibiotics  Family History  Problem Relation Age of Onset  . Diabetes Mother   . Hyperlipidemia Mother   . Hypertension Mother   . Hyperlipidemia Father   . Diabetes Father   . Cholecystitis Father   . Healthy Brother     Social History Social History  Substance Use Topics  . Smoking status: Never Smoker  . Smokeless tobacco: Never Used  . Alcohol use 0.0 oz/week     Comment: OCCASIONALLY    Review of Systems Patient denies headaches, rhinorrhea, blurry vision, numbness, shortness of breath, chest pain, edema, cough, abdominal pain, nausea, vomiting, diarrhea, dysuria, fevers, rashes or hallucinations unless otherwise stated above in HPI. ____________________________________________   PHYSICAL EXAM:  VITAL SIGNS: Vitals:   09/26/15 0912  BP: (!) 142/80  Pulse: 74  Resp: 18  Temp: 98.4 F (36.9 C)    Constitutional: Alert and oriented. Uncomfortable appearing but in no acute distress Eyes: Conjunctivae are normal. PERRL. EOMI. Head: Atraumatic. Nose: No congestion/rhinnorhea. Mouth/Throat: Mucous membranes are moist.  Oropharynx non-erythematous. Neck: No stridor. Painless ROM. No cervical spine tenderness to palpation Hematological/Lymphatic/Immunilogical: No cervical lymphadenopathy. Cardiovascular: Normal  rate, regular rhythm. Grossly normal heart sounds.  Good peripheral circulation. Respiratory: Normal respiratory effort.  No retractions. Lungs CTAB. Gastrointestinal: Soft and nontender. No distention. No abdominal bruits. No CVA tenderness. Genitourinary:  Musculoskeletal: No lower extremity tenderness  nor edema.  No joint effusions. Neurologic:  Normal speech and language. No gross focal neurologic deficits are appreciated. No gait instability. Skin:  Skin is warm, dry and intact. No rash noted. Psychiatric: Mood and affect are normal. Speech and behavior are normal.  ____________________________________________   LABS (all labs ordered are listed, but only abnormal results are displayed)  Results for orders placed or performed during the hospital encounter of 09/26/15 (from the past 24 hour(s))  Basic metabolic panel     Status: Abnormal   Collection Time: 09/26/15  9:31 AM  Result Value Ref Range   Sodium 140 135 - 145 mmol/L   Potassium 4.2 3.5 - 5.1 mmol/L   Chloride 110 101 - 111 mmol/L   CO2 25 22 - 32 mmol/L   Glucose, Bld 89 65 - 99 mg/dL   BUN 15 6 - 20 mg/dL   Creatinine, Ser 1.61 0.61 - 1.24 mg/dL   Calcium 8.7 (L) 8.9 - 10.3 mg/dL   GFR calc non Af Amer >60 >60 mL/min   GFR calc Af Amer >60 >60 mL/min   Anion gap 5 5 - 15   ____________________________________________  EKG ____________________________________________  RADIOLOGY  CT head with NAICA ____________________________________________   PROCEDURES  Procedure(s) performed: none    Critical Care performed: no ____________________________________________   INITIAL IMPRESSION / ASSESSMENT AND PLAN / ED COURSE  Pertinent labs & imaging results that were available during my care of the patient were reviewed by me and considered in my medical decision making (see chart for details).  DDX: Migraine, sinusitis, retro-orbital abscess, CNS abscess, meningitis, tension headache, dehydration  [name] is a 35 y.o. who presents to the ED with persistent and severe headache with history of chronic migraine and recent diagnosis of sinusitis without improvement on antibiotics. He arrives afebrile and hemodynamically stable but does appear uncomfortable. He has no focal neurologic deficits at this time.  I'm less suspicious for meningitis as he has no nuchal rigidity or meningeal signs. However as the patient is having worsening symptoms despite antibiotic treatment will order CT of the head to evaluate for abscess. Will provide IV hydration and continue to monitor patient while in the ED. Will provide IV pain management.  Clinical Course   CT head with NAICA but evidence of chronic sinusitis.  Pain likely 2/2 chronic migraine HA. Clinical picture is not consistent with ICH, SAH, SDH, EDH, TIA, or CVA. No concern for meningitis or encephalitis. No concern for GCA/Temporal arteritis.  Pain improved. Repeat neuro exam is again without focal deficit, nuchal rigidity or evidence of meningeal irritation.  Stable to D/C home, follow up with PCP or Neurology if persistent recurrent Has.  Have discussed with the patient and available family all diagnostics and treatments performed thus far and all questions were answered to the best of my ability. The patient demonstrates understanding and agreement with plan.     ____________________________________________   FINAL CLINICAL IMPRESSION(S) / ED DIAGNOSES  Final diagnoses:  Acute nonintractable headache, unspecified headache type  Acute frontal sinusitis, recurrence not specified      NEW MEDICATIONS STARTED DURING THIS VISIT:  New Prescriptions   No medications on file     Note:  This document was prepared using Dragon voice recognition software  and may include unintentional dictation errors.    Willy EddyPatrick Ines Rebel, MD 09/26/15 [phone]

## 2015-09-26 NOTE — ED Notes (Signed)
Patient transported to CT 

## 2015-09-26 NOTE — ED Triage Notes (Signed)
[facility] and diagnosed with sinus infection and migraine.  Yesterday c/o headache and vomiting.  Today c/o headache and dizziness.  [facility]

## 2015-09-26 NOTE — ED Triage Notes (Signed)
Taking Doxycycline, phenergan, and nasal spray for sinus infection.

## 2015-09-30 ENCOUNTER — Ambulatory Visit (INDEPENDENT_AMBULATORY_CARE_PROVIDER_SITE_OTHER): Payer: BLUE CROSS/BLUE SHIELD | Admitting: Family Medicine

## 2015-09-30 ENCOUNTER — Encounter: Payer: Self-pay | Admitting: Family Medicine

## 2015-09-30 VITALS — BP 126/90 | HR 100 | Temp 98.1°F | Resp 16 | Wt 324.0 lb

## 2015-09-30 DIAGNOSIS — J019 Acute sinusitis, unspecified: Secondary | ICD-10-CM

## 2015-09-30 MED ORDER — AMOXICILLIN 875 MG PO TABS: 875.0000 mg | ORAL_TABLET | Freq: Two times a day (BID) | ORAL | 0 refills | Status: DC

## 2015-09-30 MED ORDER — PROMETHAZINE HCL 25 MG PO TABS: 25.0000 mg | ORAL_TABLET | Freq: Four times a day (QID) | ORAL | 0 refills | Status: DC | PRN

## 2015-09-30 NOTE — Patient Instructions (Signed)
Take sudafed for congestion. Let me know if you wish to see an ENT doctor for further evaluation.

## 2015-09-30 NOTE — Progress Notes (Addendum)
Subjective:     Patient ID: Benjamin OliphantRobert C Taylor, male   DOB: 04/21/1980, 35 y.o.   MRN: [id number]  HPI  Chief Complaint  Patient presents with  . Headache    Pt also c/o dizziness, nausea, previous vomiting spells. Pt went to urgent care about 1 week ago, and was prescribed Doxy for sinusitis. Pt reports sx have recurred. Also notes BP Has been elevated; up to 144/106. Has tried Sudafed for sinusitis, without relief.  States he went to Urgent Care on 8/15 and was placed on doxycycline for a possible sinus infection. Reports that he felt his allergies were under poor control at that time. Developed increased sinus pressure, headache and elevated bp and went to the ER. CT at that time c/w mild chronic sinus infection. Provided with a migraine cocktail and told to continue doxycycline. Continues to have pressure around his eyes and ears despite taking decongestants along with dizziness and nausea. Reports white to yellow PND in the AM. States he usually responds to Amoxil 875 mg. No prior sinus surgery or allergy testing. Accompanied by his wife today.   Review of Systems     Objective:   Physical Exam  Constitutional: He appears well-developed and well-nourished. He has a sickly appearance.  Ears: T.M's intact without inflammation Sinuses: mild maxillary sinus tenderness. Throat: no tonsillar enlargement or exudate Neck: no cervical adenopathy Lungs: clear     Assessment:    1. Acute sinusitis, recurrence not specified, unspecified location: Consideration also given to migraine/rebound headache  - amoxicillin (AMOXIL) 875 MG tablet; Take 1 tablet (875 mg total) by mouth 2 (two) times daily.  Dispense: 20 tablet; Refill: 0 - promethazine (PHENERGAN) 25 MG tablet; Take 1 tablet (25 mg total) by mouth every 6 (six) hours as needed for nausea or vomiting.  Dispense: 12 tablet; Refill: 0    Plan:    Continue Sudafed. Consider ENT referral. Stop analgesics for now.

## 2015-12-26 ENCOUNTER — Encounter: Payer: Self-pay | Admitting: Family Medicine

## 2015-12-26 ENCOUNTER — Ambulatory Visit (INDEPENDENT_AMBULATORY_CARE_PROVIDER_SITE_OTHER): Payer: BLUE CROSS/BLUE SHIELD | Admitting: Family Medicine

## 2015-12-26 VITALS — BP 122/88 | HR 80 | Temp 98.2°F | Resp 16 | Wt 326.6 lb

## 2015-12-26 DIAGNOSIS — A084 Viral intestinal infection, unspecified: Secondary | ICD-10-CM

## 2015-12-26 MED ORDER — PROMETHAZINE HCL 25 MG PO TABS: 25.0000 mg | ORAL_TABLET | Freq: Four times a day (QID) | ORAL | 0 refills | Status: DC | PRN

## 2015-12-26 MED ORDER — DIPHENOXYLATE-ATROPINE 2.5-0.025 MG PO TABS: 1.0000 | ORAL_TABLET | Freq: Four times a day (QID) | ORAL | 0 refills | Status: DC | PRN

## 2015-12-26 NOTE — Patient Instructions (Signed)
Viral Gastroenteritis, Adult Viral gastroenteritis is also known as the stomach flu. This condition is caused by various viruses. These viruses can be passed from person to person very easily (are very contagious). This condition may affect your stomach, small intestine, and large intestine. It can cause sudden watery diarrhea, fever, and vomiting. Diarrhea and vomiting can make you feel weak and cause you to become dehydrated. You may not be able to keep fluids down. Dehydration can make you tired and thirsty, cause you to have a dry mouth, and decrease how often you urinate. Older adults and people with other diseases or a weak immune system are at higher risk for dehydration. It is important to replace the fluids that you lose from diarrhea and vomiting. If you become severely dehydrated, you may need to get fluids through an IV tube. What are the causes? Gastroenteritis is caused by various viruses, including rotavirus and norovirus. Norovirus is the most common cause in adults. You can get sick by eating food, drinking water, or touching a surface contaminated with one of these viruses. You can also get sick from sharing utensils or other personal items with an infected person. What increases the risk? This condition is more likely to develop in people:  Who have a weak defense system (immune system).  Who live with one or more children who are younger than 34 years old.  Who live in a nursing home.  Who go on cruise ships. What are the signs or symptoms? Symptoms of this condition start suddenly 1-2 days after exposure to a virus. Symptoms may last a few days or as long as a week. The most common symptoms are watery diarrhea and vomiting. Other symptoms include:  Fever.  Headache.  Fatigue.  Pain in the abdomen.  Chills.  Weakness.  Nausea.  Muscle aches.  Loss of appetite. How is this diagnosed? This condition is diagnosed with a medical history and physical exam. You may  also have a stool test to check for viruses or other infections. How is this treated? This condition typically goes away on its own. The focus of treatment is to restore lost fluids (rehydration). Your health care provider may recommend that you take an oral rehydration solution (ORS) to replace important salts and minerals (electrolytes) in your body. Severe cases of this condition may require giving fluids through an IV tube. Treatment may also include medicine to help with your symptoms. Follow these instructions at home: Follow instructions from your health care provider about how to care for yourself at home. Eating and drinking Follow these recommendations as told by your health care provider:  Take an ORS. This is a drink that is sold at pharmacies and retail stores.  Drink clear fluids in small amounts as you are able. Clear fluids include water, ice chips, diluted fruit juice, and low-calorie sports drinks.  Eat bland, easy-to-digest foods in small amounts as you are able. These foods include bananas, applesauce, rice, lean meats, toast, and crackers.  Avoid fluids that contain a lot of sugar or caffeine, such as energy drinks, sports drinks, and soda.  Avoid alcohol.  Avoid spicy or fatty foods. General instructions  Drink enough fluid to keep your urine clear or pale yellow.  Wash your hands often. If soap and water are not available, use hand sanitizer.  Make sure that all people in your household wash their hands well and often.  Take over-the-counter and prescription medicines only as told by your health care provider.  Rest  at home while you recover.  Watch your condition for any changes.  Take a warm bath to relieve any burning or pain from frequent diarrhea episodes.  Keep all follow-up visits as told by your health care provider. This is important. Contact a health care provider if:  You cannot keep fluids down.  Your symptoms get worse.  You have new  symptoms.  You feel light-headed or dizzy.  You have muscle cramps. Get help right away if:  You have chest pain.  You feel extremely weak or you faint.  You see blood in your vomit.  Your vomit looks like coffee grounds.  You have bloody or black stools or stools that look like tar.  You have a severe headache, a stiff neck, or both.  You have a rash.  You have severe pain, cramping, or bloating in your abdomen.  You have trouble breathing or you are breathing very quickly.  Your heart is beating very quickly.  Your skin feels cold and clammy.  You feel confused.  You have pain when you urinate.  You have signs of dehydration, such as:  Dark urine, very little urine, or no urine.  Cracked lips.  Dry mouth.  Sunken eyes.  Sleepiness.  Weakness. This information is not intended to replace advice given to you by your health care provider. Make sure you discuss any questions you have with your health care provider. Document Released: 01/26/2005 Document Revised: 07/10/2015 Document Reviewed: 10/02/2014 Elsevier Interactive Patient Education  2017 [employer] Diarrhea, Adult Diarrhea is frequent loose and watery bowel movements. Diarrhea can make you feel weak and cause you to become dehydrated. Dehydration can make you tired and thirsty, cause you to have a dry mouth, and decrease how often you urinate. Diarrhea typically lasts 2-3 days. However, it can last longer if it is a sign of something more serious. It is important to treat your diarrhea as told by your health care provider. Follow these instructions at home: Eating and drinking Follow these recommendations as told by your health care provider:  Take an oral rehydration solution (ORS). This is a drink that is sold at pharmacies and retail stores.  Drink clear fluids, such as water, ice chips, diluted fruit juice, and low-calorie sports drinks.  Eat bland, easy-to-digest foods in small amounts as you  are able. These foods include bananas, applesauce, rice, lean meats, toast, and crackers.  Avoid drinking fluids that contain a lot of sugar or caffeine, such as energy drinks, sports drinks, and soda.  Avoid alcohol.  Avoid spicy or fatty foods. General instructions  Drink enough fluid to keep your urine clear or pale yellow.  Wash your hands often. If soap and water are not available, use hand sanitizer.  Make sure that all people in your household wash their hands well and often.  Take over-the-counter and prescription medicines only as told by your health care provider.  Rest at home while you recover.  Watch your condition for any changes.  Take a warm bath to relieve any burning or pain from frequent diarrhea episodes.  Keep all follow-up visits as told by your health care provider. This is important. Contact a health care provider if:  You have a fever.  Your diarrhea gets worse.  You have new symptoms.  You cannot keep fluids down.  You feel light-headed or dizzy.  You have a headache  You have muscle cramps. Get help right away if:  You have chest pain.  You feel extremely  weak or you faint.  You have bloody or black stools or stools that look like tar.  You have severe pain, cramping, or bloating in your abdomen.  You have trouble breathing or you are breathing very quickly.  Your heart is beating very quickly.  Your skin feels cold and clammy.  You feel confused.  You have signs of dehydration, such as:  Dark urine, very little urine, or no urine.  Cracked lips.  Dry mouth.  Sunken eyes.  Sleepiness.  Weakness. This information is not intended to replace advice given to you by your health care provider. Make sure you discuss any questions you have with your health care provider. Document Released: 01/16/2002 Document Revised: 06/06/2015 Document Reviewed: 10/02/2014 Elsevier Interactive Patient Education  2017 [employer] Nausea  and Vomiting, Adult Nausea is the feeling that you have an upset stomach or have to vomit. As nausea gets worse, it can lead to vomiting. Vomiting occurs when stomach contents are thrown up and out of the mouth. Vomiting can make you feel weak and cause you to become dehydrated. Dehydration can make you tired and thirsty, cause you to have a dry mouth, and decrease how often you urinate. Older adults and people with other diseases or a weak immune system are at higher risk for dehydration. It is important to treat your nausea and vomiting as told by your health care provider. Follow these instructions at home: Follow instructions from your health care provider about how to care for yourself at home. Eating and drinking Follow these recommendations as told by your health care provider:  Take an oral rehydration solution (ORS). This is a drink that is sold at pharmacies and retail stores.  Drink clear fluids in small amounts as you are able. Clear fluids include water, ice chips, diluted fruit juice, and low-calorie sports drinks.  Eat bland, easy-to-digest foods in small amounts as you are able. These foods include bananas, applesauce, rice, lean meats, toast, and crackers.  Avoid fluids that contain a lot of sugar or caffeine, such as energy drinks, sports drinks, and soda.  Avoid alcohol.  Avoid spicy or fatty foods. General instructions  Drink enough fluid to keep your urine clear or pale yellow.  Wash your hands often. If soap and water are not available, use hand sanitizer.  Make sure that all people in your household wash their hands well and often.  Take over-the-counter and prescription medicines only as told by your health care provider.  Rest at home while you recover.  Watch your condition for any changes.  Breathe slowly and deeply when you feel nauseated.  Keep all follow-up visits as told by your health care provider. This is important. Contact a health care provider  if:  You have a fever.  You cannot keep fluids down.  Your symptoms get worse.  You have new symptoms.  Your nausea does not go away after two days.  You feel light-headed or dizzy.  You have a headache.  You have muscle cramps. Get help right away if:  You have pain in your chest, neck, arm, or jaw.  You feel extremely weak or you faint.  You have persistent vomiting.  You see blood in your vomit.  Your vomit looks like black coffee grounds.  You have bloody or black stools or stools that look like tar.  You have a severe headache, a stiff neck, or both.  You have a rash.  You have severe pain, cramping, or bloating in your  abdomen.  You have trouble breathing or you are breathing very quickly.  Your heart is beating very quickly.  Your skin feels cold and clammy.  You feel confused.  You have pain when you urinate.  You have signs of dehydration, such as:  Dark urine, very little urine, or no urine.  Cracked lips.  Dry mouth.  Sunken eyes.  Sleepiness.  Weakness. These symptoms may represent a serious problem that is an emergency. Do not wait to see if the symptoms will go away. Get medical help right away. Call your local emergency services (911 in the [place] Do not drive yourself to the hospital.  This information is not intended to replace advice given to you by your health care provider. Make sure you discuss any questions you have with your health care provider. Document Released: 01/26/2005 Document Revised: 07/01/2015 Document Reviewed: 10/02/2014 Elsevier Interactive Patient Education  2017 [employer].

## 2015-12-26 NOTE — Progress Notes (Signed)
Patient: Benjamin OliphantRobert C Adams Male    DOB: 03/29/1980   35 y.o.   MRN: [id number] Visit Date: 12/26/2015  Today's Provider: Dortha Kernennis Shayleigh Bouldin, PA   Chief Complaint  Patient presents with  . GI Problem   Subjective:    GI Problem  The primary symptoms include fatigue, nausea, vomiting, diarrhea and myalgias. The illness began today. The onset was sudden. The problem has not changed since onset. Nausea began today.  The diarrhea began today. The diarrhea is watery.  Myalgias began today. The myalgias have been unchanged since their onset. The myalgias are generalized. The myalgias are aching.  The illness is also significant for chills.   Patient Active Problem List   Diagnosis Date Noted  . Migraine with aura 10/02/2014  . Allergic rhinitis 10/02/2014  . Adiposity 10/02/2014  . Asthma 08/30/2009   Past Surgical History:  Procedure Laterality Date  . TONSILLECTOMY AND ADENOIDECTOMY     Family History  Problem Relation Age of Onset  . Diabetes Mother   . Hyperlipidemia Mother   . Hypertension Mother   . Hyperlipidemia Father   . Diabetes Father   . Cholecystitis Father   . Healthy Brother    Allergies  Allergen Reactions  . Albuterol   . Azithromycin   . Cephalexin     Other reaction(s): Vomiting  . Ciprofloxacin     makes skin feel like it is burning  . Clarithromycin Swelling  . Clavulanic Acid Itching  . Codeine     Other reaction(s): Stomach Ache, Unconsciousness  . Erythromycin   . Fluocinolone Itching  . Nitrofurantoin Monohyd Macro Itching  . Qvar  [Beclomethasone]     QVAR causes wheezing  . Sulfa Antibiotics     rash     Previous Medications   ACETAMINOPHEN (TYLENOL) 500 MG TABLET    Take by mouth.   IBUPROFEN (ADVIL,MOTRIN) 800 MG TABLET    Take 1 tablet (800 mg total) by mouth every 8 (eight) hours as needed.    Review of Systems  Constitutional: Positive for chills and fatigue.  Respiratory: Negative.   Cardiovascular: Negative.     Gastrointestinal: Positive for diarrhea, nausea and vomiting.  Musculoskeletal: Positive for myalgias.    Social History  Substance Use Topics  . Smoking status: Never Smoker  . Smokeless tobacco: Never Used  . Alcohol use 0.0 oz/week     Comment: OCCASIONALLY   Objective:   BP 122/88 (BP Location: Right Arm, Patient Position: Sitting, Cuff Size: Large)   Pulse 80   Temp 98.2 F (36.8 C) (Oral)   Resp 16   Wt (!) 326 lb 9.6 oz (148.1 kg)   SpO2 98%   BMI 46.86 kg/m   Physical Exam  Constitutional: He is oriented to person, place, and time. He appears well-developed and well-nourished. No distress.  HENT:  Head: Normocephalic and atraumatic.  Right Ear: Hearing normal.  Left Ear: Hearing normal.  Nose: Nose normal.  Eyes: Conjunctivae and lids are normal. Right eye exhibits no discharge. Left eye exhibits no discharge. No scleral icterus.  Cardiovascular: Normal rate and regular rhythm.   Pulmonary/Chest: Effort normal and breath sounds normal. No respiratory distress.  Abdominal: Soft. He exhibits no mass. There is tenderness. There is no guarding.  Generalized abdominal discomfort. BS hyperactive.  Musculoskeletal: Normal range of motion.  Neurological: He is alert and oriented to person, place, and time.  Skin: Skin is intact. No lesion and no rash noted.  Psychiatric: He has a normal mood  and affect. His speech is normal and behavior is normal. Thought content normal.      Assessment & Plan:     1. Viral gastroenteritis Onset with fatigue, vomiting, body aches and diarrhea (8-9 stools today) over the past 24 hours. Recommend bland diet with increased fluids. Given antiemetic and antidiarrheal medications. Home to rest and recheck if no better in 2-3 days. - promethazine (PHENERGAN) 25 MG tablet; Take 1 tablet (25 mg total) by mouth every 6 (six) hours as needed for nausea or vomiting.  Dispense: 20 tablet; Refill: 0 - diphenoxylate-atropine (LOMOTIL) 2.5-0.025 MG  tablet; Take 1 tablet by mouth 4 (four) times daily as needed for diarrhea or loose stools.  Dispense: 12 tablet; Refill: 0

## 2015-12-30 ENCOUNTER — Encounter: Payer: Self-pay | Admitting: Family Medicine

## 2016-02-28 ENCOUNTER — Ambulatory Visit (INDEPENDENT_AMBULATORY_CARE_PROVIDER_SITE_OTHER): Payer: BLUE CROSS/BLUE SHIELD | Admitting: Family Medicine

## 2016-02-28 ENCOUNTER — Encounter: Payer: Self-pay | Admitting: Family Medicine

## 2016-02-28 VITALS — BP 134/90 | HR 111 | Temp 99.5°F | Resp 20 | Wt 332.4 lb

## 2016-02-28 DIAGNOSIS — B349 Viral infection, unspecified: Secondary | ICD-10-CM | POA: Diagnosis not present

## 2016-02-28 MED ORDER — OSELTAMIVIR PHOSPHATE 75 MG PO CAPS: 75.0000 mg | ORAL_CAPSULE | Freq: Two times a day (BID) | ORAL | 0 refills | Status: DC

## 2016-02-28 NOTE — Patient Instructions (Signed)

## 2016-02-28 NOTE — Progress Notes (Signed)
Patient: Benjamin Adams Male    DOB: Jun 09, 1980   36 y.o.   MRN: [id number] Visit Date: 02/28/2016  Today's Provider: Dortha Kern, PA   Chief Complaint  Patient presents with  . URI   Subjective:    URI   This is a new problem. The current episode started yesterday. The problem has been unchanged. The maximum temperature recorded prior to his arrival was 103 - 104 F. Associated symptoms include congestion, coughing, a sore throat and vomiting. Associated symptoms comments: SOB at times, fever, body aches. Treatments tried: OTC cold medication. The treatment provided no relief.   Past Medical History:  Diagnosis Date  . Asthma   . Hypoglycemia    Past Surgical History:  Procedure Laterality Date  . TONSILLECTOMY AND ADENOIDECTOMY     Family History  Problem Relation Age of Onset  . Diabetes Mother   . Hyperlipidemia Mother   . Hypertension Mother   . Hyperlipidemia Father   . Diabetes Father   . Cholecystitis Father   . Healthy Brother    Allergies  Allergen Reactions  . Albuterol   . Azithromycin   . Cephalexin     Other reaction(s): Vomiting  . Ciprofloxacin     makes skin feel like it is burning  . Clarithromycin Swelling  . Clavulanic Acid Itching  . Codeine     Other reaction(s): Stomach Ache, Unconsciousness  . Erythromycin   . Fluocinolone Itching  . Nitrofurantoin Monohyd Macro Itching  . Qvar  [Beclomethasone]     QVAR causes wheezing  . Sulfa Antibiotics     rash     Previous Medications   ACETAMINOPHEN (TYLENOL) 500 MG TABLET    Take by mouth.   IBUPROFEN (ADVIL,MOTRIN) 800 MG TABLET    Take 1 tablet (800 mg total) by mouth every 8 (eight) hours as needed.    Review of Systems  Constitutional: Positive for fever.  HENT: Positive for congestion and sore throat.   Respiratory: Positive for cough and shortness of breath.   Cardiovascular: Negative.   Gastrointestinal: Positive for vomiting.  Musculoskeletal: Positive for myalgias.     Social History  Substance Use Topics  . Smoking status: Never Smoker  . Smokeless tobacco: Never Used  . Alcohol use 0.0 oz/week     Comment: OCCASIONALLY   Objective:   BP 134/90 (BP Location: Right Arm, Patient Position: Sitting, Cuff Size: Large)   Pulse (!) 111   Temp 99.5 F (37.5 C) (Oral)   Resp 20   Wt (!) 332 lb 6.4 oz (150.8 kg)   SpO2 98%   BMI 47.69 kg/m   Physical Exam  Constitutional: He is oriented to person, place, and time. He appears well-developed and well-nourished. No distress.  HENT:  Head: Normocephalic and atraumatic.  Right Ear: Hearing normal.  Left Ear: Hearing normal.  Nose: Nose normal.  Eyes: Conjunctivae and lids are normal. Right eye exhibits no discharge. Left eye exhibits no discharge. No scleral icterus.  Neck: Neck supple.  Cardiovascular: Regular rhythm and normal heart sounds.   tachycardia  Pulmonary/Chest: Effort normal. No respiratory distress.  Abdominal: Soft. Bowel sounds are normal.  Musculoskeletal: Normal range of motion.  Neurological: He is alert and oriented to person, place, and time.  Skin: Skin is intact. No lesion and no rash noted.  Psychiatric: He has a normal mood and affect. His speech is normal and behavior is normal. Thought content normal.      Assessment & Plan:  1. Viral illness Onset with fever (up to 103), body aches, cough and sore throat last night. May use FastMax cough and cold medication with Tylenol or Advil for fever. Increase fluids and home to rest. Out of work 02-28-16 through 03-03-16. May return to work on 03-04-16 if no further fever (without use of antipyretics) for 24 hours. - oseltamivir (TAMIFLU) 75 MG capsule; Take 1 capsule (75 mg total) by mouth 2 (two) times daily.  Dispense: 10 capsule; Refill: 0

## 2016-03-05 ENCOUNTER — Telehealth: Payer: Self-pay

## 2016-03-05 ENCOUNTER — Encounter: Payer: Self-pay | Admitting: Family Medicine

## 2016-03-05 ENCOUNTER — Ambulatory Visit (INDEPENDENT_AMBULATORY_CARE_PROVIDER_SITE_OTHER): Payer: BLUE CROSS/BLUE SHIELD | Admitting: Family Medicine

## 2016-03-05 VITALS — BP 90/54 | HR 74 | Temp 97.7°F | Resp 17 | Wt 327.4 lb

## 2016-03-05 DIAGNOSIS — B349 Viral infection, unspecified: Secondary | ICD-10-CM | POA: Diagnosis not present

## 2016-03-05 MED ORDER — HYDROCODONE-HOMATROPINE 5-1.5 MG/5ML PO SYRP: ORAL_SOLUTION | ORAL | 0 refills | Status: DC

## 2016-03-05 NOTE — Patient Instructions (Signed)
Let me know if you are not improving over the next few days.

## 2016-03-05 NOTE — Telephone Encounter (Signed)
FYI

## 2016-03-05 NOTE — Telephone Encounter (Signed)
Patient was given an appointment at 10:30 for with provider by Magdalene RiverMichelle W.   Triaged call: Per patient he was seen on 01/19 by Dortha Kernennis Chrismon for URI symptoms.Treated for Flu. Per patient he still has the cough, now with SOB, Chest tightness,wheezing, chest hurts with the coughin. no chest pain, arm pain, jaw pain, blurred vision.  Thanks,  -Joseline

## 2016-03-05 NOTE — Progress Notes (Signed)
Subjective:     Patient ID: Benjamin OliphantRobert C Adams, male   DOB: 10/29/1980, 36 y.o.   MRN: [id number]  HPI  Chief Complaint  Patient presents with  . Cough    Patient returns to office today with complaints of cough and shortness of breath. Patient was last seen in office 02/28/16 and diagnosed with viral illness, patient was prescribed Tamiflu but states that he could not afford medication. Patient has been taking otc Fast Maax cough and cold and Mucinex.    States asthma is a childhood problem not longer active. Reports fever has gone and cough is non-productive.   Review of Systems     Objective:   Physical Exam  Constitutional: He appears well-developed and well-nourished. He has a sickly appearance.  Ears: T.M's intact without inflammation Throat: no tonsillar enlargement or exudate Neck: no cervical adenopathy Lungs: clear     Assessment:    1. Viral illness - HYDROcodone-homatropine (HYCODAN) 5-1.5 MG/5ML syrup; 5 ml 4-6 hours as needed for cough  Dispense: 240 mL; Refill: 0    Plan:    Work excuse for 1/25-1/27.

## 2016-10-14 ENCOUNTER — Encounter: Payer: Self-pay | Admitting: Family Medicine

## 2016-10-14 ENCOUNTER — Other Ambulatory Visit: Payer: Self-pay | Admitting: Family Medicine

## 2016-10-14 ENCOUNTER — Ambulatory Visit (INDEPENDENT_AMBULATORY_CARE_PROVIDER_SITE_OTHER): Payer: BLUE CROSS/BLUE SHIELD | Admitting: Family Medicine

## 2016-10-14 VITALS — BP 106/70 | HR 84 | Temp 97.4°F | Resp 16 | Wt 323.0 lb

## 2016-10-14 DIAGNOSIS — J019 Acute sinusitis, unspecified: Secondary | ICD-10-CM | POA: Diagnosis not present

## 2016-10-14 DIAGNOSIS — J301 Allergic rhinitis due to pollen: Secondary | ICD-10-CM

## 2016-10-14 MED ORDER — FLUTICASONE PROPIONATE 50 MCG/ACT NA SUSP: 2.0000 | Freq: Every day | NASAL | 1 refills | Status: DC

## 2016-10-14 MED ORDER — AMOXICILLIN 875 MG PO TABS: 875.0000 mg | ORAL_TABLET | Freq: Two times a day (BID) | ORAL | 0 refills | Status: DC

## 2016-10-14 NOTE — Progress Notes (Signed)
Subjective:     Patient ID: Benjamin OliphantRobert C Adams, male   DOB: March 13, 1980, 36 y.o.   MRN: [id number]  HPI  Chief Complaint  Patient presents with  . Sinusitis    Started about three days ago.  . Allergic Rhinitis     Chronic issue  Reports allergy sx with progression to increased sinus pressure and purulent appearing sinus drainage. Initially was using Claritin for his sx but has added Claritin D with minimal improvement.   Review of Systems     Objective:   Physical Exam  Constitutional: He appears well-developed and well-nourished. No distress.  Ears: T.M's intact without inflammation Sinuses: mild frontal and maxillary sinus tenderness Throat: no tonsillar enlargement or exudate Neck: no cervical adenopathy Lungs: clear     Assessment:    1. Seasonal allergic rhinitis due to pollen - fluticasone (FLONASE) 50 MCG/ACT nasal spray; Place 2 sprays into both nostrils daily.  Dispense: 16 g; Refill: 1  2. Acute non-recurrent sinusitis, unspecified location - amoxicillin (AMOXIL) 875 MG tablet; Take 1 tablet (875 mg total) by mouth 2 (two) times daily.  Dispense: 20 tablet; Refill: 0    Plan:    Continue Claritin D.

## 2016-10-14 NOTE — Patient Instructions (Signed)
Continue Claritin D and start the steroid nasal spray.

## 2016-11-26 DIAGNOSIS — M1612 Unilateral primary osteoarthritis, left hip: Secondary | ICD-10-CM | POA: Diagnosis not present

## 2016-11-26 DIAGNOSIS — M25552 Pain in left hip: Secondary | ICD-10-CM | POA: Diagnosis not present

## 2016-11-26 DIAGNOSIS — G8929 Other chronic pain: Secondary | ICD-10-CM | POA: Diagnosis not present

## 2016-12-04 ENCOUNTER — Ambulatory Visit (INDEPENDENT_AMBULATORY_CARE_PROVIDER_SITE_OTHER): Payer: BLUE CROSS/BLUE SHIELD | Admitting: Family Medicine

## 2016-12-04 ENCOUNTER — Encounter: Payer: Self-pay | Admitting: Family Medicine

## 2016-12-04 VITALS — BP 126/82 | HR 95 | Temp 98.8°F | Wt 327.6 lb

## 2016-12-04 DIAGNOSIS — M25552 Pain in left hip: Secondary | ICD-10-CM | POA: Diagnosis not present

## 2016-12-04 MED ORDER — TRAMADOL HCL 50 MG PO TABS: 50.0000 mg | ORAL_TABLET | Freq: Three times a day (TID) | ORAL | 0 refills | Status: DC | PRN

## 2016-12-04 MED ORDER — PREDNISONE 10 MG PO TABS: 10.0000 mg | ORAL_TABLET | Freq: Every day | ORAL | 0 refills | Status: DC

## 2016-12-04 NOTE — Progress Notes (Signed)
Patient: Benjamin OliphantRobert C Adams Male    DOB: 08-10-1980   36 y.o.   MRN: [id number] Visit Date: 12/04/2016  Today's Provider: Dortha Kernennis Maurisha Mongeau, PA   Chief Complaint  Patient presents with  . Hip Pain   Subjective:    Hip Pain   Incident onset: around 4 years ago at work. The injury mechanism was a fall. The pain is present in the left hip. Quality: Burning sensation with lying down. Shocking sensation when weight bearing.  The pain has been constant since onset. The symptoms are aggravated by movement and weight bearing.  Patient experienced a Worker's Comp incident approximately 4 years ago. Patient was seen at [facility] last week. X-Rays were done. He was prescribed Meloxicam and advised to rest for 3 days. Patient states he had a allergic reactions to Meloxicam and had to stop taking medication.     Past Medical History:  Diagnosis Date  . Asthma   . Hypoglycemia    Past Surgical History:  Procedure Laterality Date  . TONSILLECTOMY AND ADENOIDECTOMY     Family History  Problem Relation Age of Onset  . Diabetes Mother   . Hyperlipidemia Mother   . Hypertension Mother   . Hyperlipidemia Father   . Diabetes Father   . Cholecystitis Father   . Healthy Brother    Allergies  Allergen Reactions  . Albuterol   . Azithromycin   . Cephalexin     Other reaction(s): Vomiting  . Ciprofloxacin     makes skin feel like it is burning  . Clarithromycin Swelling  . Clavulanic Acid Itching  . Codeine     Other reaction(s): Stomach Ache, Unconsciousness  . Erythromycin   . Fluocinolone Itching  . Nitrofurantoin Monohyd Macro Itching  . Qvar  [Beclomethasone]     QVAR causes wheezing  . Sulfa Antibiotics     rash    Previous Medications   LORATADINE-PSEUDOEPHEDRINE (CLARITIN-D 24 HOUR PO)    Take by mouth.    Review of Systems  Constitutional: Negative.   Respiratory: Negative.   Cardiovascular: Negative.   Musculoskeletal: Positive for arthralgias (hip pain).     Social History  Substance Use Topics  . Smoking status: Never Smoker  . Smokeless tobacco: Never Used  . Alcohol use 0.0 oz/week     Comment: OCCASIONALLY   Objective:   BP 126/82 (BP Location: Right Arm, Patient Position: Sitting, Cuff Size: Large)   Pulse 95   Temp 98.8 F (37.1 C) (Oral)   Wt (!) 327 lb 9.6 oz (148.6 kg)   SpO2 98%   BMI 47.01 kg/m   Physical Exam  Constitutional: He is oriented to person, place, and time. He appears well-developed and well-nourished. No distress.  HENT:  Head: Normocephalic and atraumatic.  Right Ear: Hearing normal.  Left Ear: Hearing normal.  Nose: Nose normal.  Eyes: Conjunctivae and lids are normal. Right eye exhibits no discharge. Left eye exhibits no discharge. No scleral icterus.  Pulmonary/Chest: Effort normal. No respiratory distress.  Musculoskeletal: He exhibits tenderness.  Left anterior groin over the hip joint. Increase in pain to test left hip ROM (worse with internal and external rotation - abduction and adduction).  Neurological: He is alert and oriented to person, place, and time.  Skin: Skin is intact. No lesion and no rash noted.  Psychiatric: He has a normal mood and affect. His speech is normal and behavior is normal. Thought content normal.      Assessment & Plan:  1. Left hip pain Has had increase in left hip pain over the past few weeks. No new injury. History of a W/C injury when he slipped in a puddle and fell in 2014. MRI at that time showed some acetabular degenerative changes. States he went to a [facility] on 11-26-16 and x-rays showed more signs of degeneration (per patient report - unable to pull up report or films). Tried taking Meloxicam but developed an itching reaction similar to his sulfa drug allergy and had to stop it. Will treat with prednisone taper and Tramadol for pain. Apply moist heat and rest the hip over the weekend. If no better next week, will need to get a copy of the  x-rays and schedule orthopedic referral. - predniSONE (DELTASONE) 10 MG tablet; Take 1 tablet (10 mg total) by mouth daily with breakfast. Taper down dose by one tablet daily (6,5,4,3,2,1).  Dispense: 21 tablet; Refill: 0 - traMADol (ULTRAM) 50 MG tablet; Take 1 tablet (50 mg total) by mouth every 8 (eight) hours as needed.  Dispense: 30 tablet; Refill: 0

## 2017-02-10 DIAGNOSIS — B9689 Other specified bacterial agents as the cause of diseases classified elsewhere: Secondary | ICD-10-CM | POA: Diagnosis not present

## 2017-02-10 DIAGNOSIS — J019 Acute sinusitis, unspecified: Secondary | ICD-10-CM | POA: Diagnosis not present

## 2017-02-10 DIAGNOSIS — J302 Other seasonal allergic rhinitis: Secondary | ICD-10-CM | POA: Diagnosis not present

## 2017-02-10 DIAGNOSIS — J209 Acute bronchitis, unspecified: Secondary | ICD-10-CM | POA: Diagnosis not present

## 2017-02-19 DIAGNOSIS — M25531 Pain in right wrist: Secondary | ICD-10-CM | POA: Diagnosis not present

## 2017-02-19 DIAGNOSIS — Z026 Encounter for examination for insurance purposes: Secondary | ICD-10-CM | POA: Diagnosis not present

## 2017-05-17 ENCOUNTER — Encounter: Payer: Self-pay | Admitting: Family Medicine

## 2017-05-17 ENCOUNTER — Ambulatory Visit: Payer: BLUE CROSS/BLUE SHIELD | Admitting: Family Medicine

## 2017-05-17 VITALS — BP 122/80 | Temp 98.7°F | Resp 16 | Wt 330.0 lb

## 2017-05-17 DIAGNOSIS — R059 Cough, unspecified: Secondary | ICD-10-CM

## 2017-05-17 DIAGNOSIS — J301 Allergic rhinitis due to pollen: Secondary | ICD-10-CM | POA: Diagnosis not present

## 2017-05-17 DIAGNOSIS — R05 Cough: Secondary | ICD-10-CM

## 2017-05-17 DIAGNOSIS — J0100 Acute maxillary sinusitis, unspecified: Secondary | ICD-10-CM

## 2017-05-17 MED ORDER — HYDROCODONE-HOMATROPINE 5-1.5 MG/5ML PO SYRP: 5.0000 mL | ORAL_SOLUTION | Freq: Three times a day (TID) | ORAL | 0 refills | Status: DC | PRN

## 2017-05-17 MED ORDER — AMOXICILLIN 875 MG PO TABS: 875.0000 mg | ORAL_TABLET | Freq: Two times a day (BID) | ORAL | 0 refills | Status: DC

## 2017-05-17 NOTE — Progress Notes (Signed)
Patient: Benjamin OliphantRobert C Adams Male    DOB: 1980-12-23   36 y.o.   MRN: [id number] Visit Date: 05/17/2017  Today's Provider: Dortha Kernennis Raydan Schlabach, PA   Chief Complaint  Patient presents with  . Sinusitis   Subjective:    Sinusitis  This is a new problem. The current episode started in the past 7 days. The problem has been gradually worsening since onset. There has been no fever. The pain is mild. Associated symptoms include congestion, coughing, ear pain, headaches, sinus pressure and a sore throat. Past treatments include acetaminophen and oral decongestants. The treatment provided mild relief.   Patient has tried Mucinex, Tylenol, and Sudafed with no relief.   Past Medical History:  Diagnosis Date  . Asthma   . Hypoglycemia    Past Surgical History:  Procedure Laterality Date  . TONSILLECTOMY AND ADENOIDECTOMY     Family History  Problem Relation Age of Onset  . Diabetes Mother   . Hyperlipidemia Mother   . Hypertension Mother   . Hyperlipidemia Father   . Diabetes Father   . Cholecystitis Father   . Healthy Brother    Allergies  Allergen Reactions  . Albuterol   . Azithromycin   . Cephalexin     Other reaction(s): Vomiting  . Ciprofloxacin     makes skin feel like it is burning  . Clarithromycin Swelling  . Clavulanic Acid Itching  . Codeine     Other reaction(s): Stomach Ache, Unconsciousness  . Erythromycin   . Fluocinolone Itching  . Nitrofurantoin Monohyd Macro Itching  . Qvar  [Beclomethasone]     QVAR causes wheezing  . Sulfa Antibiotics     rash    Current Outpatient Medications:  .  Loratadine-Pseudoephedrine (CLARITIN-D 24 HOUR PO), Take by mouth., Disp: , Rfl:  .  traMADol (ULTRAM) 50 MG tablet, Take 1 tablet (50 mg total) by mouth every 8 (eight) hours as needed., Disp: 30 tablet, Rfl: 0 .  predniSONE (DELTASONE) 10 MG tablet, Take 1 tablet (10 mg total) by mouth daily with breakfast. Taper down dose by one tablet daily (6,5,4,3,2,1). (Patient not  taking: Reported on 05/17/2017), Disp: 21 tablet, Rfl: 0  Review of Systems  HENT: Positive for congestion, ear pain, sinus pressure and sore throat.   Respiratory: Positive for cough.   Neurological: Positive for headaches.   Social History   Tobacco Use  . Smoking status: Never Smoker  . Smokeless tobacco: Never Used  Substance Use Topics  . Alcohol use: Yes    Alcohol/week: 0.0 oz    Comment: OCCASIONALLY   Objective:   BP 122/80 (BP Location: Left Arm, Patient Position: Sitting, Cuff Size: Large)   Temp 98.7 F (37.1 C)   Resp 16   Wt (!) 330 lb (149.7 kg)   BMI 47.35 kg/m  Vitals:   05/17/17 1329  BP: 122/80  Resp: 16  Temp: 98.7 F (37.1 C)  Weight: (!) 330 lb (149.7 kg)   Wt Readings from Last 3 Encounters:  05/17/17 (!) 330 lb (149.7 kg)  12/04/16 (!) 327 lb 9.6 oz (148.6 kg)  10/14/16 (!) 323 lb (146.5 kg)   Physical Exam  Constitutional: He is oriented to person, place, and time. He appears well-developed and well-nourished. No distress.  HENT:  Head: Normocephalic and atraumatic.  Right Ear: Hearing and external ear normal.  Left Ear: Hearing and external ear normal.  Nose: Nose normal.  Slight redness of turbinates and tender to palpate maxillary sinuses.  Cloudy transillumination.  Eyes: Conjunctivae and lids are normal. Right eye exhibits no discharge. Left eye exhibits no discharge. No scleral icterus.  Cardiovascular: Normal rate.  Pulmonary/Chest: Effort normal and breath sounds normal. No respiratory distress.  Musculoskeletal: Normal range of motion.  Neurological: He is alert and oriented to person, place, and time.  Skin: Skin is intact. No lesion and no rash noted.  Psychiatric: He has a normal mood and affect. His speech is normal and behavior is normal. Thought content normal.      Assessment & Plan:     1. Seasonal allergic rhinitis due to pollen Started a week ago with sneezing, nasal stuffiness and ears hurting. May continue Flonase  and Claritin. Recheck prn.  2. Subacute maxillary sinusitis Onset over the past week with stuffiness and pressure pain in maxillary sinuses. Treat with allergy medication and add Amoxil. Recheck prn. - amoxicillin (AMOXIL) 875 MG tablet; Take 1 tablet (875 mg total) by mouth 2 (two) times daily.  Dispense: 20 tablet; Refill: 0  3. Cough Ticklish cough with occasional yellow sputum from PND. May use Delsym during the day and add the Hycodan for night time cough suppression. Increase fluid intake and recheck prn. - HYDROcodone-homatropine (HYCODAN) 5-1.5 MG/5ML syrup; Take 5 mLs by mouth every 8 (eight) hours as needed for cough.  Dispense: 120 mL; Refill: 0       Dortha Kern, PA  [facility]

## 2017-09-07 ENCOUNTER — Encounter: Payer: Self-pay | Admitting: Family Medicine

## 2017-09-07 ENCOUNTER — Ambulatory Visit (INDEPENDENT_AMBULATORY_CARE_PROVIDER_SITE_OTHER): Payer: BLUE CROSS/BLUE SHIELD | Admitting: Family Medicine

## 2017-09-07 VITALS — BP 132/90 | HR 101 | Temp 99.1°F | Resp 16 | Ht 69.5 in | Wt 329.6 lb

## 2017-09-07 DIAGNOSIS — M79671 Pain in right foot: Secondary | ICD-10-CM

## 2017-09-07 DIAGNOSIS — J301 Allergic rhinitis due to pollen: Secondary | ICD-10-CM

## 2017-09-07 DIAGNOSIS — Z0000 Encounter for general adult medical examination without abnormal findings: Secondary | ICD-10-CM | POA: Diagnosis not present

## 2017-09-07 DIAGNOSIS — R350 Frequency of micturition: Secondary | ICD-10-CM | POA: Diagnosis not present

## 2017-09-07 DIAGNOSIS — M79672 Pain in left foot: Secondary | ICD-10-CM

## 2017-09-07 MED ORDER — FLUTICASONE PROPIONATE 50 MCG/ACT NA SUSP: 2.0000 | Freq: Every day | NASAL | 5 refills | Status: DC

## 2017-09-07 NOTE — Progress Notes (Signed)
  Subjective:     Patient ID: Benjamin Adams, male   DOB: 1980-08-09, 37 y.o.   MRN: [id number] Chief Complaint  Patient presents with  . Annual Exam    Patient comes in office today for his annual physical, he states that he feels fairly well today but would like to address concerns aboutg left ear pain for one week with associated headaches, patient states that he has been taking a otc Antihistamine with no improvement. Patient would also like to address polydipsia, he states that he has a history of hypoglycemia but has noticed that his is more thirsty than ususal.He is working on improving his diet, he stays active with basketball.    HPI States his allergies seem to be flaring after mowing his lawn. Reports sinus pressure but little drainage with use of Claritin D. Continues to work at [employer] and states he is on his feet most of the day moving around. He plays full court basketball on the weekend for at least two hours.   Review of Systems General: Feels fatigued.States he needs help with weight loss as he plateau's after he loses 10-15 lbs. HEENT:no  regular dental visits or eye exams. Encouraged to update. States his night vision has declined. Cardiovascular: no chest pain but has shortness of breath with heavy exertion. GI: no heartburn, no change in bowel habits or blood in the stool GU: nocturia x 2-3 Psychiatric: not depressed Musculoskeletal: Bilateral heel pain which worsens over the course of the day.    Objective:   Physical Exam  Constitutional: He appears well-developed and well-nourished. No distress.  Eyes: PERRLA Neck: no thyromegaly, tenderness or nodules, no cervical adenopathy ENT: TM's intact without inflammation. Tonsils absent Lungs: Clear Heart : RRR without murmur or gallop Abd: bowel sounds present, soft, non-tender, no organomegaly  Extremities: no edema, bilateral heels tender from posterior to plantar areas      Assessment:    1. Seasonal  allergic rhinitis due to pollen: will resume fluticasone spray  2. Annual physical exam - Comprehensive metabolic panel - Lipid panel  3. Heel pain, bilateral: consider podiatry referral  4. Urinary frequency - Comprehensive metabolic panel    Plan:    Further f/u pending lab work. If elevated sugar consider liraglutide for weight loss or appetite suppressant otherwise.

## 2017-09-07 NOTE — Patient Instructions (Signed)
Do update with a dentist and consider an eye exam.  Let me know if you wish a podiatry referral for your heels.

## 2017-09-08 DIAGNOSIS — R350 Frequency of micturition: Secondary | ICD-10-CM | POA: Diagnosis not present

## 2017-09-08 DIAGNOSIS — Z0000 Encounter for general adult medical examination without abnormal findings: Secondary | ICD-10-CM | POA: Diagnosis not present

## 2017-09-09 ENCOUNTER — Telehealth: Payer: Self-pay

## 2017-09-09 LAB — COMPREHENSIVE METABOLIC PANEL
A/G RATIO: 1.6 (ref 1.2–2.2)
ALBUMIN: 4.1 g/dL (ref 3.5–5.5)
ALT: 18 IU/L (ref 0–44)
AST: 14 IU/L (ref 0–40)
Alkaline Phosphatase: 100 IU/L (ref 39–117)
BILIRUBIN TOTAL: 0.3 mg/dL (ref 0.0–1.2)
BUN/Creatinine Ratio: 15 (ref 9–20)
BUN: 13 mg/dL (ref 6–20)
CHLORIDE: 106 mmol/L (ref 96–106)
CO2: 21 mmol/L (ref 20–29)
Calcium: 8.8 mg/dL (ref 8.7–10.2)
Creatinine, Ser: 0.89 mg/dL (ref 0.76–1.27)
GFR calc non Af Amer: 110 mL/min/{1.73_m2} (ref >59)
GFR, EST AFRICAN AMERICAN: 127 mL/min/{1.73_m2} (ref >59)
GLOBULIN, TOTAL: 2.6 g/dL (ref 1.5–4.5)
Glucose: 87 mg/dL (ref 65–99)
POTASSIUM: 4.5 mmol/L (ref 3.5–5.2)
SODIUM: 140 mmol/L (ref 134–144)
TOTAL PROTEIN: 6.7 g/dL (ref 6.0–8.5)

## 2017-09-09 LAB — LIPID PANEL
Chol/HDL Ratio: 4.1 ratio (ref 0.0–5.0)
Cholesterol, Total: 136 mg/dL (ref 100–199)
HDL: 33 mg/dL — ABNORMAL LOW (ref >39)
LDL Calculated: 89 mg/dL (ref 0–99)
Triglycerides: 70 mg/dL (ref 0–149)
VLDL CHOLESTEROL CAL: 14 mg/dL (ref 5–40)

## 2017-09-09 NOTE — Telephone Encounter (Signed)
Patient has been advised. KW 

## 2017-09-09 NOTE — Telephone Encounter (Signed)
-----   Message from Anola Gurneyobert Chauvin, [place] sent at 09/09/2017  7:41 AM EDT ----- Labs look good-no diabetes, Do follow up with Maurine Ministerennis or myself if you wish to discuss medication to help with weight loss.

## 2018-03-16 ENCOUNTER — Encounter: Payer: Self-pay | Admitting: Physician Assistant

## 2018-03-16 ENCOUNTER — Ambulatory Visit: Payer: BLUE CROSS/BLUE SHIELD | Admitting: Physician Assistant

## 2018-03-16 VITALS — BP 133/86 | HR 68 | Temp 98.0°F | Resp 16 | Wt 250.0 lb

## 2018-03-16 DIAGNOSIS — T753XXA Motion sickness, initial encounter: Secondary | ICD-10-CM

## 2018-03-16 DIAGNOSIS — J0110 Acute frontal sinusitis, unspecified: Secondary | ICD-10-CM | POA: Diagnosis not present

## 2018-03-16 MED ORDER — AMOXICILLIN 875 MG PO TABS: 875.0000 mg | ORAL_TABLET | Freq: Two times a day (BID) | ORAL | 0 refills | Status: AC

## 2018-03-16 MED ORDER — SCOPOLAMINE 1 MG/3DAYS TD PT72: 1.0000 | MEDICATED_PATCH | TRANSDERMAL | 0 refills | Status: DC

## 2018-03-16 NOTE — Patient Instructions (Signed)

## 2018-03-16 NOTE — Progress Notes (Signed)
Patient: Benjamin OliphantRobert C Adams Male    DOB: 1980-09-21   38 y.o.   MRN: [id number] Visit Date: 03/16/2018  Today's Provider: Trey SailorsAdriana M Pollak, PA-C   Chief Complaint  Patient presents with  . URI   Subjective:     HPI Upper Respiratory Infection: Patient complains of symptoms of a URI, possible sinusitis. Symptoms include bilateral ear pain, congestion and cough. Onset of symptoms was 2 weeks ago, gradually worsening since that time. He also c/o cough described as productive of yellow sputum for the past 1 week . Felt better, then felt worse.   He is drinking plenty of fluids. Evaluation to date: none. Treatment to date: cough suppressants, decongestants and nasal steroids.  Reports he is going on an 8 day cruise in a couple of months and is prone to motion sickness. Interested in patches.    Allergies  Allergen Reactions  . Albuterol   . Azithromycin   . Cephalexin     Other reaction(s): Vomiting  . Ciprofloxacin     makes skin feel like it is burning  . Clarithromycin Swelling  . Clavulanic Acid Itching  . Codeine     Other reaction(s): Stomach Ache, Unconsciousness  . Erythromycin   . Fluocinolone Itching  . Nitrofurantoin Monohyd Macro Itching  . Qvar  [Beclomethasone]     QVAR causes wheezing  . Sulfa Antibiotics     rash     Current Outpatient Medications:  .  fluticasone (FLONASE) 50 MCG/ACT nasal spray, Place 2 sprays into both nostrils daily., Disp: 16 g, Rfl: 5 .  Loratadine-Pseudoephedrine (CLARITIN-D 24 HOUR PO), Take by mouth., Disp: , Rfl:   Review of Systems  Constitutional: Negative.   HENT: Positive for congestion, ear pain, rhinorrhea, sinus pressure and sinus pain.   Respiratory: Positive for cough, shortness of breath and wheezing.     Social History   Tobacco Use  . Smoking status: Never Smoker  . Smokeless tobacco: Never Used  Substance Use Topics  . Alcohol use: Yes    Alcohol/week: 0.0 standard drinks    Comment: OCCASIONALLY        Objective:   BP 133/86 (BP Location: Left Arm, Patient Position: Sitting, Cuff Size: Normal)   Pulse 68   Temp 98 F (36.7 C) (Oral)   Resp 16   Wt 250 lb (113.4 kg)   SpO2 97%   BMI 36.39 kg/m  Vitals:   03/16/18 0844  BP: 133/86  Pulse: 68  Resp: 16  Temp: 98 F (36.7 C)  TempSrc: Oral  SpO2: 97%  Weight: 250 lb (113.4 kg)     Physical Exam Constitutional:      General: He is not in acute distress.    Appearance: He is well-developed. He is not diaphoretic.  HENT:     Right Ear: External ear normal.     Left Ear: External ear normal.     Nose:     Right Sinus: Maxillary sinus tenderness and frontal sinus tenderness present.     Left Sinus: Maxillary sinus tenderness and frontal sinus tenderness present.     Mouth/Throat:     Pharynx: No oropharyngeal exudate or posterior oropharyngeal erythema.     Tonsils: No tonsillar abscesses.  Eyes:     Conjunctiva/sclera: Conjunctivae normal.  Neck:     Musculoskeletal: Normal range of motion.  Cardiovascular:     Rate and Rhythm: Normal rate and regular rhythm.  Pulmonary:     Effort: Pulmonary effort  is normal.     Breath sounds: Normal breath sounds.  Skin:    General: Skin is warm and dry.  Neurological:     Mental Status: He is alert and oriented to person, place, and time.  Psychiatric:        Behavior: Behavior normal.         Assessment & Plan    1. Acute non-recurrent frontal sinusitis  - amoxicillin (AMOXIL) 875 MG tablet; Take 1 tablet (875 mg total) by mouth 2 (two) times daily for 7 days.  Dispense: 14 tablet; Refill: 0  2. Motion sickness, initial encounter  - amoxicillin (AMOXIL) 875 MG tablet; Take 1 tablet (875 mg total) by mouth 2 (two) times daily for 7 days.  Dispense: 14 tablet; Refill: 0 - scopolamine (TRANSDERM-SCOP, 1.5 MG,) 1 MG/3DAYS; Place 1 patch (1.5 mg total) onto the skin every 3 (three) days.  Dispense: 10 patch; Refill: 0  The entirety of the information documented in the  History of Present Illness, Review of Systems and Physical Exam were personally obtained by me. Portions of this information were initially documented by Rondel Baton, CMA and reviewed by me for thoroughness and accuracy.   Return if symptoms worsen or fail to improve.      Trey Sailors, PA-C  [facility]

## 2018-03-24 ENCOUNTER — Ambulatory Visit (INDEPENDENT_AMBULATORY_CARE_PROVIDER_SITE_OTHER): Payer: BLUE CROSS/BLUE SHIELD | Admitting: Family Medicine

## 2018-03-24 ENCOUNTER — Encounter: Payer: Self-pay | Admitting: Family Medicine

## 2018-03-24 VITALS — BP 92/70 | HR 65 | Temp 97.6°F | Resp 16 | Wt 246.0 lb

## 2018-03-24 DIAGNOSIS — E860 Dehydration: Secondary | ICD-10-CM

## 2018-03-24 DIAGNOSIS — R112 Nausea with vomiting, unspecified: Secondary | ICD-10-CM

## 2018-03-24 MED ORDER — PROMETHAZINE HCL 25 MG PO TABS: 25.0000 mg | ORAL_TABLET | Freq: Three times a day (TID) | ORAL | 0 refills | Status: DC | PRN

## 2018-03-24 MED ORDER — PROMETHAZINE HCL 25 MG/ML IJ SOLN
25.0000 mg | Freq: Once | INTRAMUSCULAR | Status: AC
  Administered 2018-03-24: 25 mg via INTRAMUSCULAR

## 2018-03-24 NOTE — Progress Notes (Signed)
Patient: Benjamin OliphantRobert C Adams Male    DOB: 1980-04-20   38 y.o.   MRN: [id number] Visit Date: 03/24/2018  Today's Provider: Dortha Kernennis Chiquita Heckert, PA   Chief Complaint  Patient presents with  . Dizziness   Subjective:     Dizziness  This is a new problem. The current episode started today. The problem occurs constantly. The problem has been gradually worsening. Associated symptoms include congestion, diaphoresis, headaches, nausea, neck pain, a sore throat, vertigo, a visual change and vomiting. Pertinent negatives include no abdominal pain, anorexia, arthralgias, change in bowel habit, chest pain, chills, coughing, fatigue, fever, joint swelling, myalgias, numbness, rash, swollen glands, urinary symptoms or weakness. Nothing aggravates the symptoms.    Patient states around 12:00 pm today he got a headache and became dizzy. Patient states shortly after that he began vomiting. Patient states he has vomited about 4 times since it started.  Patient also has symptoms of nausea, sweats, neck pain, sore throat, vertigo sensation, blurry vision, and congested ears. Patient states dizziness is constant.    Past Medical History:  Diagnosis Date  . Asthma   . Hypoglycemia    Past Surgical History:  Procedure Laterality Date  . TONSILLECTOMY AND ADENOIDECTOMY     Family History  Problem Relation Age of Onset  . Diabetes Mother   . Hyperlipidemia Mother   . Hypertension Mother   . Hyperlipidemia Father   . Diabetes Father   . Cholecystitis Father   . Healthy Brother    Allergies  Allergen Reactions  . Albuterol   . Azithromycin   . Cephalexin     Other reaction(s): Vomiting  . Ciprofloxacin     makes skin feel like it is burning  . Clarithromycin Swelling  . Clavulanic Acid Itching  . Codeine     Other reaction(s): Stomach Ache, Unconsciousness  . Erythromycin   . Fluocinolone Itching  . Nitrofurantoin Monohyd Macro Itching  . Qvar  [Beclomethasone]     QVAR causes wheezing  .  Sulfa Antibiotics     rash    Current Outpatient Medications:  .  Loratadine-Pseudoephedrine (CLARITIN-D 24 HOUR PO), Take by mouth., Disp: , Rfl:  .  scopolamine (TRANSDERM-SCOP, 1.5 MG,) 1 MG/3DAYS, Place 1 patch (1.5 mg total) onto the skin every 3 (three) days., Disp: 10 patch, Rfl: 0 .  fluticasone (FLONASE) 50 MCG/ACT nasal spray, Place 2 sprays into both nostrils daily. (Patient not taking: Reported on 03/24/2018), Disp: 16 g, Rfl: 5  Review of Systems  Constitutional: Positive for diaphoresis. Negative for appetite change, chills, fatigue and fever.  HENT: Positive for congestion and sore throat.   Respiratory: Negative for cough, chest tightness, shortness of breath and wheezing.   Cardiovascular: Negative for chest pain and palpitations.  Gastrointestinal: Positive for nausea and vomiting. Negative for abdominal pain, anorexia and change in bowel habit.  Musculoskeletal: Positive for neck pain. Negative for arthralgias, joint swelling and myalgias.  Skin: Negative for rash.  Neurological: Positive for dizziness, vertigo and headaches. Negative for weakness and numbness.   Social History   Tobacco Use  . Smoking status: Never Smoker  . Smokeless tobacco: Never Used  Substance Use Topics  . Alcohol use: Yes    Alcohol/week: 0.0 standard drinks    Comment: OCCASIONALLY     Objective:   BP 92/70 (BP Location: Right Arm, Patient Position: Sitting, Cuff Size: Large)   Pulse 65   Temp 97.6 F (36.4 C) (Oral)   Resp 16  Wt 246 lb (111.6 kg)   SpO2 98%   BMI 35.81 kg/m  BP (as he sits up from supine position for 5-6 minutes) was 106/80  Wt Readings from Last 3 Encounters:  03/24/18 246 lb (111.6 kg)  03/16/18 250 lb (113.4 kg)  09/07/17 (!) 329 lb 9.6 oz (149.5 kg)   Vitals:   03/24/18 1411  BP: 92/70  Pulse: 65  Resp: 16  Temp: 97.6 F (36.4 C)  TempSrc: Oral  SpO2: 98%  Weight: 246 lb (111.6 kg)   Physical Exam Constitutional:      Appearance: He is  ill-appearing and diaphoretic.  HENT:     Head: Normocephalic.     Right Ear: Tympanic membrane normal.     Left Ear: Tympanic membrane normal.     Nose: Nose normal.     Mouth/Throat:     Mouth: Mucous membranes are dry.     Pharynx: Oropharynx is clear.  Eyes:     Conjunctiva/sclera: Conjunctivae normal.  Neck:     Musculoskeletal: Neck supple. No neck rigidity.     Vascular: No carotid bruit.  Cardiovascular:     Rate and Rhythm: Normal rate and regular rhythm.     Pulses: Normal pulses.     Heart sounds: Normal heart sounds.  Pulmonary:     Effort: Pulmonary effort is normal.     Breath sounds: Normal breath sounds.  Abdominal:     Palpations: Abdomen is soft.     Tenderness: There is no abdominal tenderness. There is no guarding.     Comments: Quite bowel sounds.  Musculoskeletal: Normal range of motion.  Lymphadenopathy:     Cervical: No cervical adenopathy.  Skin:    Coloration: Skin is pale.  Neurological:     Mental Status: He is oriented to person, place, and time.       Assessment & Plan    1. Nausea and vomiting in adult Onset at noon today. No diarrhea. Vomited 4 times before coming to the office. Very pale and BP down to 92/70 ( back up to 106/80 after lying down for 5-6 minutes). Had an episode of acute frontal sinusitis on 03-16-18 and treated with Amoxil. No further vomiting in the office. Given Promethazine injection. Has been working on weight loss for the past 8-9 months and has lost "90 lbs" with diet restrictions and regular exercise at the gym. Given Rx for Promethazine tablets and home to rest. Must get extra fluids or may have to go to the ER for IV rehydration if any syncope or worsening N&V. Contacted wife to transport him home. Out of work for 3 days. Recheck prn. - promethazine (PHENERGAN) injection 25 mg  2. Dehydration Low BP, dizzy and feeling weak since vomiting several times this afternoon. Must rehydrate with Gatorade, Ginger Ale or Sprite.  May get some broth or Jell-O. Recheck prn. May need to go to ER for IV fluids if symptoms persist or syncope occurs.      Dortha Kernennis Shivaay Stormont, PA  [facility]

## 2018-05-24 ENCOUNTER — Ambulatory Visit: Payer: BLUE CROSS/BLUE SHIELD | Admitting: Physician Assistant

## 2018-05-24 ENCOUNTER — Other Ambulatory Visit: Payer: Self-pay

## 2018-05-24 ENCOUNTER — Encounter: Payer: Self-pay | Admitting: Physician Assistant

## 2018-05-24 VITALS — BP 135/88 | HR 70 | Temp 98.1°F | Resp 16 | Wt 248.2 lb

## 2018-05-24 DIAGNOSIS — J019 Acute sinusitis, unspecified: Secondary | ICD-10-CM | POA: Diagnosis not present

## 2018-05-24 MED ORDER — AMOXICILLIN 875 MG PO TABS
875.0000 mg | ORAL_TABLET | Freq: Two times a day (BID) | ORAL | 0 refills | Status: AC
Start: 2018-05-24 — End: 2018-06-03

## 2018-05-24 NOTE — Patient Instructions (Signed)

## 2018-05-24 NOTE — Progress Notes (Signed)
Patient: Benjamin OliphantRobert C Adams Male    DOB: [birth year]   38 y.o.   MRN: [id number] Visit Date: 05/25/2018  Today's Provider: Trey SailorsAdriana M Pollak, PA-C   Chief Complaint  Patient presents with  . Sinusitis   Subjective:     HPI  Patient with a history of allergies presents with severe Sinus pain and pressure, bilateral ear pain, sneezing, and headaches worsening over the past5 days. Patient is currently taking Claritin for the symptoms. Medication refill on Flonase.  Allergies  Allergen Reactions  . Albuterol   . Azithromycin   . Cephalexin     Other reaction(s): Vomiting  . Ciprofloxacin     makes skin feel like it is burning  . Clarithromycin Swelling  . Clavulanic Acid Itching  . Codeine     Other reaction(s): Stomach Ache, Unconsciousness  . Erythromycin   . Fluocinolone Itching  . Nitrofurantoin Monohyd Macro Itching  . Qvar  [Beclomethasone]     QVAR causes wheezing  . Sulfa Antibiotics     rash     Current Outpatient Medications:  .  amoxicillin (AMOXIL) 875 MG tablet, Take 1 tablet (875 mg total) by mouth 2 (two) times daily for 10 days., Disp: 20 tablet, Rfl: 0 .  fluticasone (FLONASE) 50 MCG/ACT nasal spray, Place 2 sprays into both nostrils daily. (Patient not taking: Reported on 03/24/2018), Disp: 16 g, Rfl: 5 .  Loratadine-Pseudoephedrine (CLARITIN-D 24 HOUR PO), Take by mouth., Disp: , Rfl:  .  promethazine (PHENERGAN) 25 MG tablet, Take 1 tablet (25 mg total) by mouth every 8 (eight) hours as needed for nausea or vomiting. (Patient not taking: Reported on 05/24/2018), Disp: 20 tablet, Rfl: 0 .  scopolamine (TRANSDERM-SCOP, 1.5 MG,) 1 MG/3DAYS, Place 1 patch (1.5 mg total) onto the skin every 3 (three) days. (Patient not taking: Reported on 05/24/2018), Disp: 10 patch, Rfl: 0  Review of Systems  Constitutional: Negative.   HENT: Positive for congestion, ear pain, sinus pressure and sinus pain.   Eyes: Negative.   Respiratory: Negative.   Cardiovascular:  Negative.   Gastrointestinal: Negative.   Endocrine: Negative.   Genitourinary: Negative.   Musculoskeletal: Negative.   Skin: Negative.   Neurological: Negative.   Hematological: Negative.   Psychiatric/Behavioral: Negative.     Social History   Tobacco Use  . Smoking status: Never Smoker  . Smokeless tobacco: Never Used  Substance Use Topics  . Alcohol use: Yes    Alcohol/week: 0.0 standard drinks    Comment: OCCASIONALLY      Objective:   BP 135/88 (BP Location: Left Arm, Patient Position: Sitting, Cuff Size: Normal)   Pulse 70   Temp 98.1 F (36.7 C) (Oral)   Resp 16   Wt 248 lb 3.2 oz (112.6 kg)   BMI 36.13 kg/m  Vitals:   05/24/18 1458  BP: 135/88  Pulse: 70  Resp: 16  Temp: 98.1 F (36.7 C)  TempSrc: Oral  Weight: 248 lb 3.2 oz (112.6 kg)     Physical Exam Constitutional:      General: He is not in acute distress.    Appearance: He is well-developed. He is not diaphoretic.  HENT:     Right Ear: External ear normal.     Left Ear: External ear normal.     Nose:     Right Sinus: Maxillary sinus tenderness and frontal sinus tenderness present.     Left Sinus: Maxillary sinus tenderness and frontal sinus tenderness present.  Mouth/Throat:     Pharynx: No oropharyngeal exudate or posterior oropharyngeal erythema.     Tonsils: No tonsillar abscesses.  Eyes:     Conjunctiva/sclera: Conjunctivae normal.  Neck:     Musculoskeletal: Normal range of motion.  Cardiovascular:     Rate and Rhythm: Normal rate and regular rhythm.  Pulmonary:     Effort: Pulmonary effort is normal.     Breath sounds: Normal breath sounds.  Skin:    General: Skin is warm and dry.  Neurological:     Mental Status: He is alert and oriented to person, place, and time.  Psychiatric:        Behavior: Behavior normal.         Assessment & Plan    1. Acute non-recurrent sinusitis, unspecified location  - amoxicillin (AMOXIL) 875 MG tablet; Take 1 tablet (875 mg total)  by mouth 2 (two) times daily for 10 days.  Dispense: 20 tablet; Refill: 0 - fluticasone (FLONASE) 50 MCG/ACT nasal spray; Place 2 sprays into both nostrils daily.  Dispense: 16 g; Refill: 5  The entirety of the information documented in the History of Present Illness, Review of Systems and Physical Exam were personally obtained by me. Portions of this information were initially documented by Lexine Baton, LPN and reviewed by me for thoroughness and accuracy.   F/u PRN.       Trey Sailors, PA-C  [facility]

## 2018-05-25 MED ORDER — FLUTICASONE PROPIONATE 50 MCG/ACT NA SUSP: 2.0000 | Freq: Every day | NASAL | 5 refills | Status: DC

## 2018-08-22 ENCOUNTER — Emergency Department: Payer: BC Managed Care – PPO

## 2018-08-22 ENCOUNTER — Other Ambulatory Visit: Payer: Self-pay

## 2018-08-22 ENCOUNTER — Emergency Department
Admission: EM | Admit: 2018-08-22 | Discharge: 2018-08-22 | Disposition: A | Discharge status: Home / Self Care | Payer: BC Managed Care – PPO | Attending: Emergency Medicine | Admitting: Emergency Medicine

## 2018-08-22 ENCOUNTER — Encounter: Payer: Self-pay | Admitting: Emergency Medicine

## 2018-08-22 DIAGNOSIS — J019 Acute sinusitis, unspecified: Secondary | ICD-10-CM | POA: Insufficient documentation

## 2018-08-22 DIAGNOSIS — J45909 Unspecified asthma, uncomplicated: Secondary | ICD-10-CM | POA: Insufficient documentation

## 2018-08-22 DIAGNOSIS — J329 Chronic sinusitis, unspecified: Secondary | ICD-10-CM | POA: Diagnosis not present

## 2018-08-22 DIAGNOSIS — R112 Nausea with vomiting, unspecified: Secondary | ICD-10-CM | POA: Diagnosis not present

## 2018-08-22 DIAGNOSIS — R519 Headache, unspecified: Secondary | ICD-10-CM

## 2018-08-22 DIAGNOSIS — R51 Headache: Secondary | ICD-10-CM | POA: Diagnosis not present

## 2018-08-22 DIAGNOSIS — G43909 Migraine, unspecified, not intractable, without status migrainosus: Secondary | ICD-10-CM | POA: Diagnosis not present

## 2018-08-22 LAB — CBC WITH DIFFERENTIAL/PLATELET
Abs Immature Granulocytes: 0.02 10*3/uL (ref 0.00–0.07)
Basophils Absolute: 0 10*3/uL (ref 0.0–0.1)
Basophils Relative: 0 %
Eosinophils Absolute: 0.2 10*3/uL (ref 0.0–0.5)
Eosinophils Relative: 3 %
HCT: 41.7 % (ref 39.0–52.0)
Hemoglobin: 13.9 g/dL (ref 13.0–17.0)
Immature Granulocytes: 0 %
Lymphocytes Relative: 25 %
Lymphs Abs: 1.9 10*3/uL (ref 0.7–4.0)
MCH: 31.8 pg (ref 26.0–34.0)
MCHC: 33.3 g/dL (ref 30.0–36.0)
MCV: 95.4 fL (ref 80.0–100.0)
Monocytes Absolute: 0.3 10*3/uL (ref 0.1–1.0)
Monocytes Relative: 4 %
Neutro Abs: 5.2 10*3/uL (ref 1.7–7.7)
Neutrophils Relative %: 68 %
Platelets: 157 10*3/uL (ref 150–400)
RBC: 4.37 MIL/uL (ref 4.22–5.81)
RDW: 13.2 % (ref 11.5–15.5)
WBC: 7.6 10*3/uL (ref 4.0–10.5)
nRBC: 0 % (ref 0.0–0.2)

## 2018-08-22 LAB — BASIC METABOLIC PANEL
Anion gap: 9 (ref 5–15)
BUN: 18 mg/dL (ref 6–20)
CO2: 25 mmol/L (ref 22–32)
Calcium: 8.9 mg/dL (ref 8.9–10.3)
Chloride: 109 mmol/L (ref 98–111)
Creatinine, Ser: 0.71 mg/dL (ref 0.61–1.24)
GFR calc Af Amer: >60 mL/min (ref >60)
GFR calc non Af Amer: >60 mL/min (ref >60)
Glucose, Bld: 101 mg/dL — ABNORMAL HIGH (ref 70–99)
Potassium: 3.7 mmol/L (ref 3.5–5.1)
Sodium: 143 mmol/L (ref 135–145)

## 2018-08-22 MED ORDER — KETOROLAC TROMETHAMINE 30 MG/ML IJ SOLN
15.0000 mg | INTRAMUSCULAR | Status: AC
  Administered 2018-08-22: 15:00:00 15 mg via INTRAVENOUS
  Filled 2018-08-22: qty 1

## 2018-08-22 MED ORDER — DIPHENHYDRAMINE HCL 50 MG/ML IJ SOLN
50.0000 mg | Freq: Once | INTRAMUSCULAR | Status: AC
Start: 2018-08-22 — End: 2018-08-22
  Administered 2018-08-22: 50 mg via INTRAVENOUS
  Filled 2018-08-22: qty 1

## 2018-08-22 MED ORDER — SODIUM CHLORIDE 0.9 % IV BOLUS: 1000.0000 mL | Freq: Once | INTRAVENOUS | Status: DC

## 2018-08-22 MED ORDER — DOXYCYCLINE MONOHYDRATE 100 MG PO TABS: 100.0000 mg | ORAL_TABLET | Freq: Two times a day (BID) | ORAL | 0 refills | Status: AC

## 2018-08-22 MED ORDER — METOCLOPRAMIDE HCL 5 MG/ML IJ SOLN
10.0000 mg | Freq: Once | INTRAMUSCULAR | Status: AC
  Administered 2018-08-22: 10 mg via INTRAVENOUS
  Filled 2018-08-22: qty 2

## 2018-08-22 MED ORDER — METHYLPREDNISOLONE SODIUM SUCC 125 MG IJ SOLR
125.0000 mg | Freq: Once | INTRAMUSCULAR | Status: AC
  Administered 2018-08-22: 125 mg via INTRAVENOUS
  Filled 2018-08-22: qty 2

## 2018-08-22 NOTE — ED Provider Notes (Signed)
[facility] Emergency Department Provider Note   ____________________________________________   I have reviewed the triage vital signs and the nursing notes.   HISTORY  Chief Complaint Headache and Emesis   History limited by: Not Limited   HPI Benjamin Adams is a 38 y.o. male who presents to the emergency department today because of concern for headache. The patient states that for the past week or so he has been having problems with sinusitis. He states that he has history of sinusitis in the past and has also had issues with migraines in the past. Today at work he felt started having extreme pressure. Located behind his right eye and near his right ear. Did have nausea and vomiting.   Records reviewed. Per medical record review patient has a history of migraine.   Past Medical History:  Diagnosis Date  . Asthma   . Hypoglycemia     Patient Active Problem List   Diagnosis Date Noted  . Migraine with aura 10/02/2014  . Allergic rhinitis 10/02/2014  . Adiposity 10/02/2014    Past Surgical History:  Procedure Laterality Date  . TONSILLECTOMY AND ADENOIDECTOMY      Prior to Admission medications   Medication Sig Start Date End Date Taking? Authorizing Provider  fluticasone (FLONASE) 50 MCG/ACT nasal spray Place 2 sprays into both nostrils daily. 05/25/18   Trinna Post, PA-C  Loratadine-Pseudoephedrine (CLARITIN-D 24 HOUR PO) Take by mouth.    [provider]    Allergies Albuterol, Azithromycin, Cephalexin, Ciprofloxacin, Clarithromycin, Clavulanic acid, Codeine, Erythromycin, Fluocinolone, Nitrofurantoin monohyd macro, Qvar  [beclomethasone], and Sulfa antibiotics  Family History  Problem Relation Age of Onset  . Diabetes Mother   . Hyperlipidemia Mother   . Hypertension Mother   . Hyperlipidemia Father   . Diabetes Father   . Cholecystitis Father   . Healthy Brother     Social History Social History   Tobacco Use  .  Smoking status: Never Smoker  . Smokeless tobacco: Never Used  Substance Use Topics  . Alcohol use: Yes    Alcohol/week: 0.0 standard drinks    Comment: OCCASIONALLY  . Drug use: No    Review of Systems Constitutional: No fever/chills Eyes: No visual changes. ENT: Positive for right ear pain.  Cardiovascular: Denies chest pain. Respiratory: Denies shortness of breath. Gastrointestinal: No abdominal pain.  No nausea, no vomiting.  No diarrhea.   Genitourinary: Negative for dysuria. Musculoskeletal: Negative for back pain. Skin: Negative for rash. Neurological: Positive for headache.  ____________________________________________   PHYSICAL EXAM:  VITAL SIGNS: ED Triage Vitals  Enc Vitals Group     BP 08/22/18 1459 134/84     Pulse Rate 08/22/18 1459 81     Resp 08/22/18 1459 16     Temp 08/22/18 1459 97.7 F (36.5 C)     Temp Source 08/22/18 1459 Oral     SpO2 08/22/18 1459 98 %     Weight 08/22/18 1501 250 lb (113.4 kg)     Height 08/22/18 1501 5\' 9"  (1.753 m)     Head Circumference --      Peak Flow --      Pain Score 08/22/18 1500 9   Constitutional: Alert and oriented.  Eyes: Conjunctivae are normal.  ENT      Head: Normocephalic and atraumatic.      Nose: No congestion/rhinnorhea.      Ears: Bilateral TMs without erythema or bulging.       Mouth/Throat: Mucous membranes are moist.  Neck: No stridor. Hematological/Lymphatic/Immunilogical: No cervical lymphadenopathy. Cardiovascular: Normal rate, regular rhythm.  No murmurs, rubs, or gallops.  Respiratory: Normal respiratory effort without tachypnea nor retractions. Breath sounds are clear and equal bilaterally. No wheezes/rales/rhonchi. Gastrointestinal: Soft and non tender. No rebound. No guarding.  Genitourinary: Deferred Musculoskeletal: Normal range of motion in all extremities. No lower extremity edema. Neurologic:  Normal speech and language. No gross focal neurologic deficits are appreciated.   Skin:  Skin is warm, dry and intact. No rash noted. Psychiatric: Mood and affect are normal. Speech and behavior are normal. Patient exhibits appropriate insight and judgment.  ____________________________________________    LABS (pertinent positives/negatives)  CBC wbc 7.6, hgb 13.9, plt 157 BMP wnl except glu 101  ____________________________________________   EKG  I, Phineas SemenGraydon Myrta Mercer, attending physician, personally viewed and interpreted this EKG  EKG Time: 1509 Rate: 77 Rhythm: normal sinus rhythm Axis: normal Intervals: qtc 459 QRS: narrow ST changes: no st elevation Impression: abnormal ekg   ____________________________________________    RADIOLOGY  CT head No acute abnormality  ____________________________________________   PROCEDURES  Procedures  ____________________________________________   INITIAL IMPRESSION / ASSESSMENT AND PLAN / ED COURSE  Pertinent labs & imaging results that were available during my care of the patient were reviewed by me and considered in my medical decision making (see chart for details).   Patient presented to the emergency department today because of concerns for bad migraine and possible sinusitis.  Patient was given medications prior to my evaluation and did feel better.  Patient has history of sinusitis.  Discussed with patient will place patient on antibiotics.  ____________________________________________   FINAL CLINICAL IMPRESSION(S) / ED DIAGNOSES  Final diagnoses:  Bad headache  Sinusitis, unspecified chronicity, unspecified location     Note: This dictation was prepared with Dragon dictation. Any transcriptional errors that result from this process are unintentional     Phineas SemenGoodman, Rise Traeger, MD 08/22/18 2019

## 2018-08-22 NOTE — ED Notes (Signed)
Spoke with Dr. Joni Fears regarding patient. Orders placed.

## 2018-08-22 NOTE — ED Notes (Signed)
Pt ambulatory from triage, A&O x 4 in NAD.

## 2018-08-22 NOTE — Discharge Instructions (Addendum)
Please seek medical attention for any high fevers, chest pain, shortness of breath, change in behavior, persistent vomiting, bloody stool or any other new or concerning symptoms.  

## 2018-08-22 NOTE — ED Triage Notes (Signed)
Patient reports pain in bilateral ears, neck and face x2 days. States today at 145, he started to have a generalized headache with dizziness and nausea/vomiting. Patient reports history of migraines and sinus headaches. States normally with those, after he vomits, his headache is improved but that has not happened this time.

## 2018-08-24 ENCOUNTER — Other Ambulatory Visit: Payer: Self-pay

## 2018-08-24 ENCOUNTER — Encounter: Payer: Self-pay | Admitting: Physician Assistant

## 2018-08-24 ENCOUNTER — Ambulatory Visit: Payer: BC Managed Care – PPO | Admitting: Physician Assistant

## 2018-08-24 VITALS — BP 117/77 | HR 74 | Temp 98.4°F | Resp 16 | Ht 70.0 in | Wt 259.0 lb

## 2018-08-24 DIAGNOSIS — R0981 Nasal congestion: Secondary | ICD-10-CM | POA: Diagnosis not present

## 2018-08-24 DIAGNOSIS — G43809 Other migraine, not intractable, without status migrainosus: Secondary | ICD-10-CM | POA: Diagnosis not present

## 2018-08-24 NOTE — Progress Notes (Signed)
Patient: Benjamin OliphantRobert C Adams Male    DOB: Jun 14, 1980   38 y.o.   MRN: [id number] Visit Date: 08/24/2018  Today's Provider: Trey SailorsAdriana M Mayra Jolliffe, PA-C   Chief Complaint  Patient presents with  . Hospitalization Follow-up   Subjective:     HPI    Follow up ER visit  Patient was seen in ER for Headache on 08/22/2018. He was treated for Headache and Emesis. Treatment for this included; labs, ekg, CT Head. Reports he had a migrainous headache three days ago and also some sinus congestion. Since starting doxycycline  He reports good compliance with treatment. He reports this condition is Improved.  -----------------------------------------------------------------  Patient states he feels much improved since ER visit. Patient is still taking doxycycline he was prescribed 08/22/2018 in ER. Patient states his symptoms of ear pain, facial pain and headache have improved. He was not tested for COVID.   Allergies  Allergen Reactions  . Albuterol   . Azithromycin   . Cephalexin     Other reaction(s): Vomiting  . Ciprofloxacin     makes skin feel like it is burning  . Clarithromycin Swelling  . Clavulanic Acid Itching  . Codeine     Other reaction(s): Stomach Ache, Unconsciousness  . Erythromycin   . Fluocinolone Itching  . Nitrofurantoin Monohyd Macro Itching  . Qvar  [Beclomethasone]     QVAR causes wheezing  . Sulfa Antibiotics     rash     Current Outpatient Medications:  .  doxycycline (ADOXA) 100 MG tablet, Take 1 tablet (100 mg total) by mouth 2 (two) times daily for 7 days., Disp: 14 tablet, Rfl: 0 .  Loratadine-Pseudoephedrine (CLARITIN-D 24 HOUR PO), Take by mouth., Disp: , Rfl:  .  fluticasone (FLONASE) 50 MCG/ACT nasal spray, Place 2 sprays into both nostrils daily. (Patient not taking: Reported on 08/24/2018), Disp: 16 g, Rfl: 5  Review of Systems  Constitutional: Negative for appetite change, chills and fever.  Respiratory: Negative for chest tightness, shortness  of breath and wheezing.   Cardiovascular: Negative for chest pain and palpitations.  Gastrointestinal: Negative for abdominal pain, nausea and vomiting.    Social History   Tobacco Use  . Smoking status: Never Smoker  . Smokeless tobacco: Never Used  Substance Use Topics  . Alcohol use: Yes    Alcohol/week: 0.0 standard drinks    Comment: OCCASIONALLY      Objective:   BP 117/77 (BP Location: Left Arm, Patient Position: Sitting, Cuff Size: Large)   Pulse 74   Temp 98.4 F (36.9 C) (Oral)   Resp 16   Ht 5\' 10"  (1.778 m)   Wt 259 lb (117.5 kg)   SpO2 97%   BMI 37.16 kg/m  Vitals:   08/24/18 1021  BP: 117/77  Pulse: 74  Resp: 16  Temp: 98.4 F (36.9 C)  TempSrc: Oral  SpO2: 97%  Weight: 259 lb (117.5 kg)  Height: 5\' 10"  (1.778 m)     Physical Exam Constitutional:      Appearance: Normal appearance. He is not ill-appearing or toxic-appearing.  Pulmonary:     Effort: Pulmonary effort is normal. No respiratory distress.  Skin:    General: Skin is warm and dry.  Neurological:     Mental Status: He is alert and oriented to person, place, and time. Mental status is at baseline.  Psychiatric:        Mood and Affect: Mood normal.        Behavior:  Behavior normal.      No results found for any visits on 08/24/18.     Assessment & Plan    1. Sinus congestion  Patient appears well but I think he should be tested for COVID before returning. Have counseled on return precautions and provided work note.   - Novel Coronavirus, NAA ([facility])  2. Other migraine without status migrainosus, not intractable  The entirety of the information documented in the History of Present Illness, Review of Systems and Physical Exam were personally obtained by me. Portions of this information were initially documented by April Miller, CMA and reviewed by me for thoroughness and accuracy.   F/u PRN     Trinna Post, PA-C  [facility]

## 2018-08-28 LAB — NOVEL CORONAVIRUS, NAA: SARS-CoV-2, NAA: NOT DETECTED

## 2018-08-29 ENCOUNTER — Telehealth: Payer: Self-pay

## 2018-08-29 ENCOUNTER — Encounter: Payer: Self-pay | Admitting: Physician Assistant

## 2018-08-29 NOTE — Telephone Encounter (Signed)
-----   Message from Trinna Post, [place] sent at 08/29/2018  1:25 PM EDT ----- COVID testing negative.

## 2018-08-29 NOTE — Telephone Encounter (Signed)
Pt had a negative result from his Covid test on Wednesday  He now needs a note to return to work  [employer]

## 2018-08-29 NOTE — Telephone Encounter (Signed)
Work note sent through Mychart

## 2018-08-29 NOTE — Telephone Encounter (Signed)
Patient notified of results. He states that he needs a letter to return to work sent to his [employer].

## 2019-03-09 ENCOUNTER — Encounter: Payer: Self-pay | Admitting: Physician Assistant

## 2019-03-09 ENCOUNTER — Ambulatory Visit (INDEPENDENT_AMBULATORY_CARE_PROVIDER_SITE_OTHER): Payer: BC Managed Care – PPO | Admitting: Physician Assistant

## 2019-03-09 VITALS — Temp 97.4°F

## 2019-03-09 DIAGNOSIS — J0110 Acute frontal sinusitis, unspecified: Secondary | ICD-10-CM

## 2019-03-09 MED ORDER — AMOXICILLIN 875 MG PO TABS: 875.0000 mg | ORAL_TABLET | Freq: Two times a day (BID) | ORAL | 0 refills | Status: AC

## 2019-03-09 NOTE — Progress Notes (Signed)
Patient: Benjamin Adams Male    DOB: 13-Jul-1980   38 y.o.   MRN: [id number] Visit Date: 03/09/2019  Today's Provider: Trey Sailors, PA-C   Chief Complaint  Patient presents with  . Ear Pain   Subjective:    Virtual Visit via Telephone Note  I connected with Benjamin Adams on 03/09/19 at 11:00 AM EST by telephone and verified that I am speaking with the correct person using two identifiers.  Location: Patient: Home Provider: Office   I discussed the limitations, risks, security and privacy concerns of performing an evaluation and management service by telephone and the availability of in person appointments. I also discussed with the patient that there may be a patient responsible charge related to this service. The patient expressed understanding and agreed to proceed.   Otalgia  There is pain in both ears. This is a new problem. Episode onset: 3 weeks ago. The problem occurs constantly. The problem has been gradually worsening. There has been no fever. Associated symptoms include neck pain. Pertinent negatives include no abdominal pain, coughing, ear discharge, headaches, sore throat or vomiting. Treatments tried: Flonase nasal sray. The treatment provided no relief.  Patient reports the ear pain radiates down into his jaw and neck. It hurts to lay on his ears or wear a mask. Patient reports popping sounds in his ears when he yawns. He denies any redness or swelling, but reports his ears are warm to touch. Denies fever. Denies SOB.  Allergies  Allergen Reactions  . Albuterol   . Azithromycin   . Cephalexin     Other reaction(s): Vomiting  . Ciprofloxacin     makes skin feel like it is burning  . Clarithromycin Swelling  . Clavulanic Acid Itching  . Codeine     Other reaction(s): Stomach Ache, Unconsciousness  . Erythromycin   . Fluocinolone Itching  . Nitrofurantoin Monohyd Macro Itching  . Qvar  [Beclomethasone]     QVAR causes wheezing  . Sulfa Antibiotics       rash     Current Outpatient Medications:  .  fluticasone (FLONASE) 50 MCG/ACT nasal spray, Place 2 sprays into both nostrils daily., Disp: 16 g, Rfl: 5 .  Loratadine-Pseudoephedrine (CLARITIN-D 24 HOUR PO), Take by mouth., Disp: , Rfl:   Review of Systems  Constitutional: Negative for appetite change, chills and fever.  HENT: Positive for ear pain. Negative for ear discharge and sore throat.   Respiratory: Negative for cough, chest tightness, shortness of breath and wheezing.   Cardiovascular: Negative for chest pain and palpitations.  Gastrointestinal: Negative for abdominal pain, nausea and vomiting.  Musculoskeletal: Positive for neck pain.  Neurological: Negative for headaches.    Social History   Tobacco Use  . Smoking status: Never Smoker  . Smokeless tobacco: Never Used  Substance Use Topics  . Alcohol use: Not Currently    Alcohol/week: 0.0 standard drinks      Objective:   Temp (!) 97.4 F (36.3 C) (Temporal)  Vitals:   03/09/19 0857  Temp: (!) 97.4 F (36.3 C)  TempSrc: Temporal  There is no height or weight on file to calculate BMI.   Physical Exam Pulmonary:     Effort: Pulmonary effort is normal. No respiratory distress.      No results found for any visits on 03/09/19.     Assessment & Plan    1. Acute non-recurrent frontal sinusitis  - amoxicillin (AMOXIL) 875 MG tablet; Take 1 tablet (  875 mg total) by mouth 2 (two) times daily for 10 days.  Dispense: 20 tablet; Refill: 0  The entirety of the information documented in the History of Present Illness, Review of Systems and Physical Exam were personally obtained by me. Portions of this information were initially documented by [facility] and reviewed by me for thoroughness and accuracy.      Trinna Post, PA-C  [facility]

## 2019-03-09 NOTE — Patient Instructions (Signed)

## 2019-03-20 ENCOUNTER — Telehealth: Payer: Self-pay | Admitting: Family Medicine

## 2019-03-20 NOTE — Telephone Encounter (Signed)
LMTCB  Will need to speak with patient

## 2019-03-20 NOTE — Telephone Encounter (Signed)
Pt called to find out if Moderna would be an ok covid vaccine to take with all of his allergies/ Pt would like a call from the nurse/please advise

## 2019-03-21 NOTE — Telephone Encounter (Signed)
Patient is concerned bc of all the medications he is allergic to,would it be contraindicated for him to take a covid shot.

## 2019-03-21 NOTE — Telephone Encounter (Signed)
So no contraindication with interaction or allergic reactions from previous medication allergies

## 2019-03-21 NOTE — Telephone Encounter (Signed)
All the MORE reason to get the COVID vaccination.

## 2019-03-21 NOTE — Telephone Encounter (Signed)
The multiple antibiotic allergies (even Azithromycin), steroid inhaler (QVAR) and other meds, makes it necessary to get immunity built up against COVID. None of these chemicals/drugs are in the COVID vaccine and should not be a contraindication. All the MORE reason to be sure to get the COVID vaccination.

## 2019-03-22 NOTE — Telephone Encounter (Signed)
Advised through voicemail

## 2019-04-17 ENCOUNTER — Other Ambulatory Visit: Payer: Self-pay

## 2019-04-17 ENCOUNTER — Emergency Department
Admission: EM | Admit: 2019-04-17 | Discharge: 2019-04-17 | Disposition: A | Discharge status: Home / Self Care | Payer: BC Managed Care – PPO | Attending: Emergency Medicine | Admitting: Emergency Medicine

## 2019-04-17 DIAGNOSIS — Z79899 Other long term (current) drug therapy: Secondary | ICD-10-CM | POA: Diagnosis not present

## 2019-04-17 DIAGNOSIS — R1110 Vomiting, unspecified: Secondary | ICD-10-CM | POA: Diagnosis not present

## 2019-04-17 DIAGNOSIS — Z20822 Contact with and (suspected) exposure to covid-19: Secondary | ICD-10-CM | POA: Insufficient documentation

## 2019-04-17 DIAGNOSIS — E860 Dehydration: Secondary | ICD-10-CM

## 2019-04-17 DIAGNOSIS — J45909 Unspecified asthma, uncomplicated: Secondary | ICD-10-CM | POA: Insufficient documentation

## 2019-04-17 DIAGNOSIS — R112 Nausea with vomiting, unspecified: Secondary | ICD-10-CM | POA: Diagnosis not present

## 2019-04-17 LAB — COMPREHENSIVE METABOLIC PANEL
ALT: 21 U/L (ref 0–44)
AST: 17 U/L (ref 15–41)
Albumin: 3.9 g/dL (ref 3.5–5.0)
Alkaline Phosphatase: 71 U/L (ref 38–126)
Anion gap: 3 — ABNORMAL LOW (ref 5–15)
BUN: 21 mg/dL — ABNORMAL HIGH (ref 6–20)
CO2: 26 mmol/L (ref 22–32)
Calcium: 8.2 mg/dL — ABNORMAL LOW (ref 8.9–10.3)
Chloride: 109 mmol/L (ref 98–111)
Creatinine, Ser: 0.69 mg/dL (ref 0.61–1.24)
GFR calc Af Amer: >60 mL/min (ref >60)
GFR calc non Af Amer: >60 mL/min (ref >60)
Glucose, Bld: 99 mg/dL (ref 70–99)
Potassium: 4.4 mmol/L (ref 3.5–5.1)
Sodium: 138 mmol/L (ref 135–145)
Total Bilirubin: 0.8 mg/dL (ref 0.3–1.2)
Total Protein: 7 g/dL (ref 6.5–8.1)

## 2019-04-17 LAB — URINALYSIS, COMPLETE (UACMP) WITH MICROSCOPIC
Bacteria, UA: NONE SEEN
Bilirubin Urine: NEGATIVE
Glucose, UA: NEGATIVE mg/dL
Hgb urine dipstick: NEGATIVE
Ketones, ur: NEGATIVE mg/dL
Leukocytes,Ua: NEGATIVE
Nitrite: NEGATIVE
Protein, ur: NEGATIVE mg/dL
Specific Gravity, Urine: 1.019 (ref 1.005–1.030)
pH: 5 (ref 5.0–8.0)

## 2019-04-17 LAB — CBC WITH DIFFERENTIAL/PLATELET
Abs Immature Granulocytes: 0.04 10*3/uL (ref 0.00–0.07)
Basophils Absolute: 0 10*3/uL (ref 0.0–0.1)
Basophils Relative: 1 %
Eosinophils Absolute: 0.2 10*3/uL (ref 0.0–0.5)
Eosinophils Relative: 4 %
HCT: 44.8 % (ref 39.0–52.0)
Hemoglobin: 14.5 g/dL (ref 13.0–17.0)
Immature Granulocytes: 1 %
Lymphocytes Relative: 28 %
Lymphs Abs: 1.7 10*3/uL (ref 0.7–4.0)
MCH: 31.8 pg (ref 26.0–34.0)
MCHC: 32.4 g/dL (ref 30.0–36.0)
MCV: 98.2 fL (ref 80.0–100.0)
Monocytes Absolute: 0.3 10*3/uL (ref 0.1–1.0)
Monocytes Relative: 5 %
Neutro Abs: 3.5 10*3/uL (ref 1.7–7.7)
Neutrophils Relative %: 61 %
Platelets: 168 10*3/uL (ref 150–400)
RBC: 4.56 MIL/uL (ref 4.22–5.81)
RDW: 12.6 % (ref 11.5–15.5)
WBC: 5.8 10*3/uL (ref 4.0–10.5)
nRBC: 0 % (ref 0.0–0.2)

## 2019-04-17 LAB — SARS CORONAVIRUS 2 (TAT 6-24 HRS): SARS Coronavirus 2: NEGATIVE

## 2019-04-17 MED ORDER — ONDANSETRON HCL 4 MG/2ML IJ SOLN
4.0000 mg | Freq: Once | INTRAMUSCULAR | Status: AC
  Administered 2019-04-17: 4 mg via INTRAVENOUS
  Filled 2019-04-17: qty 2

## 2019-04-17 MED ORDER — ONDANSETRON 4 MG PO TBDP: 4.0000 mg | ORAL_TABLET | Freq: Three times a day (TID) | ORAL | 0 refills | Status: DC | PRN

## 2019-04-17 MED ORDER — SODIUM CHLORIDE 0.9 % IV BOLUS
1000.0000 mL | Freq: Once | INTRAVENOUS | Status: AC
  Administered 2019-04-17: 1000 mL via INTRAVENOUS

## 2019-04-17 MED ORDER — FAMOTIDINE IN NACL 20-0.9 MG/50ML-% IV SOLN
20.0000 mg | Freq: Once | INTRAVENOUS | Status: AC
  Administered 2019-04-17: 20 mg via INTRAVENOUS
  Filled 2019-04-17: qty 50

## 2019-04-17 NOTE — ED Provider Notes (Signed)
[facility] Emergency Department Provider Note  ____________________________________________  Time seen: Approximately 6:20 AM  I have reviewed the triage vital signs and the nursing notes.   HISTORY  Chief Complaint Dizziness and Emesis   HPI Benjamin Adams is a 39 y.o. male the history of asthma and migraine headaches who presents for evaluation of vomiting and dizziness.  Patient reports that he has been fatigued for the last few days.  This morning he woke up and had several episodes of nonbloody nonbilious emesis.  No hematemesis or coffee-ground emesis.  He is feeling dizzy/lightheaded.  No fever or chills, no cough or sore throat, no chest pain or shortness of breath, no diarrhea or constipation, no abdominal pain, no loss of taste or smell.  His wife does work in [employer] but he has had no known exposures to [employer].   Past Medical History:  Diagnosis Date  . Asthma   . Hypoglycemia     Patient Active Problem List   Diagnosis Date Noted  . Migraine with aura 10/02/2014  . Allergic rhinitis 10/02/2014  . Adiposity 10/02/2014    Past Surgical History:  Procedure Laterality Date  . TONSILLECTOMY AND ADENOIDECTOMY    . WISDOM TOOTH EXTRACTION      Prior to Admission medications   Medication Sig Start Date End Date Taking? Authorizing Provider  fluticasone (FLONASE) 50 MCG/ACT nasal spray Place 2 sprays into both nostrils daily. 05/25/18   Trey Sailors, PA-C  Loratadine-Pseudoephedrine (CLARITIN-D 24 HOUR PO) Take by mouth.    [provider]  ondansetron (ZOFRAN ODT) 4 MG disintegrating tablet Take 1 tablet (4 mg total) by mouth every 8 (eight) hours as needed. 04/17/19   Nita Sickle, MD    Allergies Albuterol, Azithromycin, Cephalexin, Ciprofloxacin, Clarithromycin, Clavulanic acid, Codeine, Erythromycin, Fluocinolone, Nitrofurantoin monohyd macro, Qvar  [beclomethasone], and Sulfa antibiotics  Family History    Problem Relation Age of Onset  . Diabetes Mother   . Hyperlipidemia Mother   . Hypertension Mother   . Hyperlipidemia Father   . Diabetes Father   . Cholecystitis Father   . Healthy Brother     Social History Social History   Tobacco Use  . Smoking status: Never Smoker  . Smokeless tobacco: Never Used  Substance Use Topics  . Alcohol use: Not Currently    Alcohol/week: 0.0 standard drinks  . Drug use: No    Review of Systems  Constitutional: Negative for fever. + dizziness and fatigue Eyes: Negative for visual changes. ENT: Negative for sore throat. Neck: No neck pain  Cardiovascular: Negative for chest pain. Respiratory: Negative for shortness of breath. Gastrointestinal: Negative for abdominal pain,  Diarrhea. + N/V Genitourinary: Negative for dysuria. Musculoskeletal: Negative for back pain. Skin: Negative for rash. Neurological: Negative for headaches, weakness or numbness. Psych: No SI or HI  ____________________________________________   PHYSICAL EXAM:  VITAL SIGNS: Vitals:   04/17/19 0623  BP: 117/73  Pulse: 81  Temp: (!) 97.5 F (36.4 C)  SpO2: 99%    Constitutional: Alert and oriented. Well appearing and in no apparent distress. HEENT:      Head: Normocephalic and atraumatic.         Eyes: Conjunctivae are normal. Sclera is non-icteric.       Mouth/Throat: Mucous membranes are dry.       Neck: Supple with no signs of meningismus. Cardiovascular: Regular rate and rhythm. No murmurs, gallops, or rubs. 2+ symmetrical distal pulses are present in all extremities.  No JVD. Respiratory: Normal respiratory effort. Lungs are clear to auscultation bilaterally. No wheezes, crackles, or rhonchi.  Gastrointestinal: Soft, non tender, and non distended with positive bowel sounds. No rebound or guarding. Genitourinary: No CVA tenderness. Musculoskeletal: Nontender with normal range of motion in all extremities. No edema, cyanosis, or erythema of  extremities. Neurologic: Normal speech and language. Face is symmetric. Moving all extremities. No gross focal neurologic deficits are appreciated. Skin: Skin is warm, dry and intact. No rash noted. Psychiatric: Mood and affect are normal. Speech and behavior are normal.  ____________________________________________   LABS (all labs ordered are listed, but only abnormal results are displayed)  Labs Reviewed  SARS CORONAVIRUS 2 (TAT 6-24 HRS)  CBC WITH DIFFERENTIAL/PLATELET  URINALYSIS, COMPLETE (UACMP) WITH MICROSCOPIC  COMPREHENSIVE METABOLIC PANEL   ____________________________________________  EKG  ED ECG REPORT I, Rudene Re, the attending physician, personally viewed and interpreted this ECG.  Normal sinus rhythm, rate of 75, normal intervals, normal axis, no ST elevations or depressions, T wave inversions in inferior leads new when compared to prior ____________________________________________  RADIOLOGY  none  ____________________________________________   PROCEDURES  Procedure(s) performed: None Procedures Critical Care performed:  None ____________________________________________   INITIAL IMPRESSION / ASSESSMENT AND PLAN / ED COURSE  39 y.o. male the history of asthma and migraine headaches who presents for evaluation of vomiting and dizziness.  Patient is well-appearing in no distress, looks slightly dry, abdomen is soft with no tenderness and no distention.  Differential diagnosis including gastritis versus GERD versus peptic ulcer disease versus viral gastroenteritis versus Covid versus SBO.  Plan for CBC, CMP, lipase, EKG, IV fluids, IV pepcid, and Zofran.  Will swab for Covid.   _________________________ 7:37 AM on 04/17/2019 -----------------------------------------  Labs pending.  Plan to reassess after IV fluids and Zofran with p.o. challenge.  Care transferred to Dr. Jimmye Norman.       _____________________________________________ Please note:  Patient was evaluated in Emergency Department today for the symptoms described in the history of present illness. Patient was evaluated in the context of the global COVID-19 pandemic, which necessitated consideration that the patient might be at risk for infection with the SARS-CoV-2 virus that causes COVID-19. Institutional protocols and algorithms that pertain to the evaluation of patients at risk for COVID-19 are in a state of rapid change based on information released by regulatory bodies including the CDC and federal and state organizations. These policies and algorithms were followed during the patient's care in the ED.  Some ED evaluations and interventions may be delayed as a result of limited staffing during the pandemic.   ____________________________________________   FINAL CLINICAL IMPRESSION(S) / ED DIAGNOSES   Final diagnoses:  Non-intractable vomiting with nausea, unspecified vomiting type  Dehydration      NEW MEDICATIONS STARTED DURING THIS VISIT:  ED Discharge Orders         Ordered    ondansetron (ZOFRAN ODT) 4 MG disintegrating tablet  Every 8 hours PRN     04/17/19 4270           Note:  This document was prepared using Dragon voice recognition software and may include unintentional dictation errors.    Alfred Levins, [place], MD 04/17/19 [phone]

## 2019-04-17 NOTE — ED Triage Notes (Signed)
Patient c/o dizziness, N/V. Patient states: "it feels like i'm dehydrated". Patient denies pain.

## 2019-09-07 ENCOUNTER — Encounter: Payer: Self-pay | Admitting: Family Medicine

## 2019-09-26 NOTE — Progress Notes (Signed)
Complete physical exam   Patient: Benjamin Adams   DOB: 11/24/1980   39 y.o. Male  MRN: [id number] Visit Date: 09/28/2019  Today's healthcare provider: Margaretann Loveless, PA-C   Chief Complaint  Patient presents with  . Annual Exam   Subjective    Benjamin Adams is a 39 y.o. male who presents today for a complete physical exam.  He reports consuming a well balanced diet. Home exercise routine includes walking 1 hrs per day. He generally feels well. He reports sleeping fairly well. He does not have additional problems to discuss today.  HPI   Past Medical History:  Diagnosis Date  . Asthma   . Hypoglycemia    Past Surgical History:  Procedure Laterality Date  . TONSILLECTOMY AND ADENOIDECTOMY    . WISDOM TOOTH EXTRACTION     Social History   Socioeconomic History  . Marital status: Married    Spouse name: Not on file  . Number of children: Not on file  . Years of education: Not on file  . Highest education level: Not on file  Occupational History  . Not on file  Tobacco Use  . Smoking status: Never Smoker  . Smokeless tobacco: Never Used  Substance and Sexual Activity  . Alcohol use: Not Currently    Alcohol/week: 0.0 standard drinks  . Drug use: No  . Sexual activity: Not on file  Other Topics Concern  . Not on file  Social History Narrative  . Not on file   Social Determinants of Health   Financial Resource Strain:   . Difficulty of Paying Living Expenses: Not on file  Food Insecurity:   . Worried About Programme researcher, broadcasting/film/video in the Last Year: Not on file  . Ran Out of Food in the Last Year: Not on file  Transportation Needs:   . Lack of Transportation (Medical): Not on file  . Lack of Transportation (Non-Medical): Not on file  Physical Activity:   . Days of Exercise per Week: Not on file  . Minutes of Exercise per Session: Not on file  Stress:   . Feeling of Stress : Not on file  Social Connections:   . Frequency of Communication with Friends  and Family: Not on file  . Frequency of Social Gatherings with Friends and Family: Not on file  . Attends Religious Services: Not on file  . Active Member of Clubs or Organizations: Not on file  . Attends Banker Meetings: Not on file  . Marital Status: Not on file  Intimate Partner Violence:   . Fear of Current or Ex-Partner: Not on file  . Emotionally Abused: Not on file  . Physically Abused: Not on file  . Sexually Abused: Not on file   Family Status  Relation Name Status  . Mother  Alive  . Father  Alive  . Brother  Alive   Family History  Problem Relation Age of Onset  . Diabetes Mother   . Hyperlipidemia Mother   . Hypertension Mother   . Hyperlipidemia Father   . Diabetes Father   . Cholecystitis Father   . Healthy Brother    Allergies  Allergen Reactions  . Albuterol   . Azithromycin   . Cephalexin     Other reaction(s): Vomiting  . Ciprofloxacin     makes skin feel like it is burning  . Clarithromycin Swelling  . Clavulanic Acid Itching  . Codeine     Other reaction(s): Stomach  Ache, Unconsciousness  . Erythromycin   . Fluocinolone Itching  . Nitrofurantoin Monohyd Macro Itching  . Qvar  [Beclomethasone]     QVAR causes wheezing  . Sulfa Antibiotics     rash    Patient Care Team: Chrismon, Jodell Cipro, PA as PCP - General (Physician Assistant)   Medications: Outpatient Medications Prior to Visit  Medication Sig  . fluticasone (FLONASE) 50 MCG/ACT nasal spray Place 2 sprays into both nostrils daily.  . Loratadine-Pseudoephedrine (CLARITIN-D 24 HOUR PO) Take by mouth.  . ondansetron (ZOFRAN ODT) 4 MG disintegrating tablet Take 1 tablet (4 mg total) by mouth every 8 (eight) hours as needed.   No facility-administered medications prior to visit.    Review of Systems  Constitutional: Positive for fatigue.  HENT: Negative.   Eyes: Positive for photophobia.  Respiratory: Negative.   Cardiovascular: Negative.   Gastrointestinal: Negative.    Endocrine: Negative.   Genitourinary: Negative.   Musculoskeletal: Negative.   Skin: Negative.   Allergic/Immunologic: Negative.   Neurological: Negative.   Hematological: Negative.   Psychiatric/Behavioral: Positive for sleep disturbance.    Last CBC Lab Results  Component Value Date   WBC 5.6 09/29/2019   HGB 14.6 09/29/2019   HCT 44.0 09/29/2019   MCV 97 09/29/2019   MCH 32.2 09/29/2019   RDW 13.2 09/29/2019   PLT 200 09/29/2019   Last metabolic panel Lab Results  Component Value Date   GLUCOSE 86 09/29/2019   NA 141 09/29/2019   K 4.7 09/29/2019   CL 107 (H) 09/29/2019   CO2 19 (L) 09/29/2019   BUN 17 09/29/2019   CREATININE 0.76 09/29/2019   GFRNONAA 116 09/29/2019   GFRAA 134 09/29/2019   CALCIUM 8.8 09/29/2019   PROT 6.5 09/29/2019   ALBUMIN 4.3 09/29/2019   LABGLOB 2.2 09/29/2019   AGRATIO 2.0 09/29/2019   BILITOT 0.4 09/29/2019   ALKPHOS 83 09/29/2019   AST 17 09/29/2019   ALT 16 09/29/2019   ANIONGAP 3 (L) 04/17/2019      Objective    BP 111/73 (BP Location: Right Arm, Patient Position: Sitting, Cuff Size: Large)   Pulse 71   Temp 99.7 F (37.6 C) (Oral)   Resp 16   Ht 5\' 8"  (1.727 m)   Wt 271 lb 12.8 oz (123.3 kg)   BMI 41.33 kg/m  BP Readings from Last 3 Encounters:  09/28/19 111/73  04/17/19 101/67  08/24/18 117/77   Wt Readings from Last 3 Encounters:  09/28/19 271 lb 12.8 oz (123.3 kg)  04/17/19 250 lb (113.4 kg)  08/24/18 259 lb (117.5 kg)      Physical Exam Vitals reviewed.  Constitutional:      General: He is not in acute distress.    Appearance: Normal appearance. He is well-developed. He is obese. He is not ill-appearing.  HENT:     Head: Normocephalic and atraumatic.     Right Ear: Tympanic membrane, ear canal and external ear normal.     Left Ear: Tympanic membrane, ear canal and external ear normal.     Nose: Nose normal.     Mouth/Throat:     Mouth: Mucous membranes are moist.     Pharynx: Oropharynx is clear.    Eyes:     General: No scleral icterus.       Right eye: No discharge.        Left eye: No discharge.     Extraocular Movements: Extraocular movements intact.     Conjunctiva/sclera: Conjunctivae normal.  Pupils: Pupils are equal, round, and reactive to light.  Neck:     Thyroid: No thyromegaly.     Vascular: No carotid bruit.     Trachea: No tracheal deviation.  Cardiovascular:     Rate and Rhythm: Normal rate and regular rhythm.     Pulses: Normal pulses.     Heart sounds: Normal heart sounds. No murmur heard.   Pulmonary:     Effort: Pulmonary effort is normal. No respiratory distress.     Breath sounds: Normal breath sounds. No wheezing or rales.  Chest:     Chest wall: No tenderness.  Abdominal:     General: Abdomen is flat. Bowel sounds are normal. There is no distension.     Palpations: Abdomen is soft. There is no mass.     Tenderness: There is no abdominal tenderness. There is no guarding or rebound.  Musculoskeletal:        General: No tenderness. Normal range of motion.     Cervical back: Normal range of motion and neck supple.     Right lower leg: No edema.     Left lower leg: No edema.  Lymphadenopathy:     Cervical: No cervical adenopathy.  Skin:    General: Skin is warm and dry.     Capillary Refill: Capillary refill takes less than 2 seconds.     Findings: No erythema or rash.  Neurological:     General: No focal deficit present.     Mental Status: He is alert and oriented to person, place, and time. Mental status is at baseline.     Cranial Nerves: No cranial nerve deficit.     Motor: No abnormal muscle tone.     Coordination: Coordination normal.     Deep Tendon Reflexes: Reflexes are normal and symmetric. Reflexes normal.  Psychiatric:        Mood and Affect: Mood normal.        Behavior: Behavior normal.        Thought Content: Thought content normal.        Judgment: Judgment normal.     Last depression screening scores PHQ 2/9 Scores  09/28/2019 09/28/2019 12/04/2016  PHQ - 2 Score 0 0 0   Last fall risk screening No flowsheet data found. Last Audit-C alcohol use screening Alcohol Use Disorder Test (AUDIT) 09/28/2019  1. How often do you have a drink containing alcohol? 2  2. How many drinks containing alcohol do you have on a typical day when you are drinking? 1  3. How often do you have six or more drinks on one occasion? 0  AUDIT-C Score 3  Alcohol Brief Interventions/Follow-up AUDIT Score <7 follow-up not indicated   A score of 3 or more in women, and 4 or more in men indicates increased risk for alcohol abuse, EXCEPT if all of the points are from question 1   No results found for any visits on 09/28/19.  Assessment & Plan    Routine Health Maintenance and Physical Exam  Exercise Activities and Dietary recommendations Goals   None     Immunization History  Administered Date(s) Administered  . Moderna SARS-COVID-2 Vaccination 04/26/2019, 05/24/2019    Health Maintenance  Topic Date Due  . Hepatitis C Screening  Never done  . HIV Screening  Never done  . TETANUS/TDAP  Never done  . INFLUENZA VACCINE  09/10/2019  . COVID-19 Vaccine  Completed    Discussed health benefits of physical activity, and encouraged him to  engage in regular exercise appropriate for his age and condition.  1. Annual physical exam Normal physical exam today. Will check labs as below and f/u pending lab results. If labs are stable and WNL he will not need to have these rechecked for one year at his next annual physical exam. He is to call the office in the meantime if he has any acute issue, questions or concerns. - CBC w/Diff/Platelet - Comprehensive Metabolic Panel (CMET) - TSH - Lipid Panel With LDL/HDL Ratio - HgB A1c  2. Class 3 severe obesity due to excess calories with serious comorbidity and body mass index (BMI) of 40.0 to 44.9 in adult [facility](HCC) Counseled patient on healthy lifestyle modifications including dieting and  exercise.  Will check labs as below and f/u pending results. Patient has done wonderfully and has lost 100 pounds in a little over a year's time with healthy lifestyle changes.  - CBC w/Diff/Platelet - Comprehensive Metabolic Panel (CMET) - TSH - Lipid Panel With LDL/HDL Ratio - HgB A1c  3. Encounter for biometric screening Will check labs as below and f/u pending results. - CBC w/Diff/Platelet - Comprehensive Metabolic Panel (CMET) - TSH - Lipid Panel With LDL/HDL Ratio - HgB A1c  4. Encounter for hepatitis C screening test for low risk patient Will check labs as below and f/u pending results. - Hepatitis C Antibody  5. Screening for HIV without presence of risk factors Will check labs as below and f/u pending results. - HIV antibody (with reflex)   No follow-ups on file.     Delmer IslamI, Nickey Canedo M Baptiste Littler, PA-C, have reviewed all documentation for this visit. The documentation on 10/03/19 for the exam, diagnosis, procedures, and orders are all accurate and complete.   Reine JustJennifer M Wallice Granville, PA-C  [facility] [phone] (phone) [phone] (fax)  [facility]

## 2019-09-28 ENCOUNTER — Encounter: Payer: Self-pay | Admitting: Physician Assistant

## 2019-09-28 ENCOUNTER — Ambulatory Visit (INDEPENDENT_AMBULATORY_CARE_PROVIDER_SITE_OTHER): Payer: BC Managed Care – PPO | Admitting: Physician Assistant

## 2019-09-28 ENCOUNTER — Other Ambulatory Visit: Payer: Self-pay

## 2019-09-28 VITALS — BP 111/73 | HR 71 | Temp 99.7°F | Resp 16 | Ht 68.0 in | Wt 271.8 lb

## 2019-09-28 DIAGNOSIS — Z1159 Encounter for screening for other viral diseases: Secondary | ICD-10-CM

## 2019-09-28 DIAGNOSIS — Z0000 Encounter for general adult medical examination without abnormal findings: Secondary | ICD-10-CM | POA: Diagnosis not present

## 2019-09-28 DIAGNOSIS — Z114 Encounter for screening for human immunodeficiency virus [HIV]: Secondary | ICD-10-CM

## 2019-09-28 DIAGNOSIS — Z008 Encounter for other general examination: Secondary | ICD-10-CM

## 2019-09-28 DIAGNOSIS — Z6841 Body Mass Index (BMI) 40.0 and over, adult: Secondary | ICD-10-CM

## 2019-09-28 NOTE — Patient Instructions (Signed)

## 2019-09-29 DIAGNOSIS — Z0000 Encounter for general adult medical examination without abnormal findings: Secondary | ICD-10-CM | POA: Diagnosis not present

## 2019-09-29 DIAGNOSIS — Z6841 Body Mass Index (BMI) 40.0 and over, adult: Secondary | ICD-10-CM | POA: Diagnosis not present

## 2019-09-29 DIAGNOSIS — Z008 Encounter for other general examination: Secondary | ICD-10-CM | POA: Diagnosis not present

## 2019-09-30 LAB — COMPREHENSIVE METABOLIC PANEL
ALT: 16 IU/L (ref 0–44)
AST: 17 IU/L (ref 0–40)
Albumin/Globulin Ratio: 2 (ref 1.2–2.2)
Albumin: 4.3 g/dL (ref 4.0–5.0)
Alkaline Phosphatase: 83 IU/L (ref 48–121)
BUN/Creatinine Ratio: 22 — ABNORMAL HIGH (ref 9–20)
BUN: 17 mg/dL (ref 6–20)
Bilirubin Total: 0.4 mg/dL (ref 0.0–1.2)
CO2: 19 mmol/L — ABNORMAL LOW (ref 20–29)
Calcium: 8.8 mg/dL (ref 8.7–10.2)
Chloride: 107 mmol/L — ABNORMAL HIGH (ref 96–106)
Creatinine, Ser: 0.76 mg/dL (ref 0.76–1.27)
GFR calc Af Amer: 134 mL/min/{1.73_m2} (ref >59)
GFR calc non Af Amer: 116 mL/min/{1.73_m2} (ref >59)
Globulin, Total: 2.2 g/dL (ref 1.5–4.5)
Glucose: 86 mg/dL (ref 65–99)
Potassium: 4.7 mmol/L (ref 3.5–5.2)
Sodium: 141 mmol/L (ref 134–144)
Total Protein: 6.5 g/dL (ref 6.0–8.5)

## 2019-09-30 LAB — CBC WITH DIFFERENTIAL/PLATELET
Basophils Absolute: 0 10*3/uL (ref 0.0–0.2)
Basos: 1 %
EOS (ABSOLUTE): 0.2 10*3/uL (ref 0.0–0.4)
Eos: 3 %
Hematocrit: 44 % (ref 37.5–51.0)
Hemoglobin: 14.6 g/dL (ref 13.0–17.7)
Immature Grans (Abs): 0 10*3/uL (ref 0.0–0.1)
Immature Granulocytes: 1 %
Lymphocytes Absolute: 1.5 10*3/uL (ref 0.7–3.1)
Lymphs: 26 %
MCH: 32.2 pg (ref 26.6–33.0)
MCHC: 33.2 g/dL (ref 31.5–35.7)
MCV: 97 fL (ref 79–97)
Monocytes Absolute: 0.4 10*3/uL (ref 0.1–0.9)
Monocytes: 7 %
Neutrophils Absolute: 3.5 10*3/uL (ref 1.4–7.0)
Neutrophils: 62 %
Platelets: 200 10*3/uL (ref 150–450)
RBC: 4.54 x10E6/uL (ref 4.14–5.80)
RDW: 13.2 % (ref 11.6–15.4)
WBC: 5.6 10*3/uL (ref 3.4–10.8)

## 2019-09-30 LAB — LIPID PANEL WITH LDL/HDL RATIO
Cholesterol, Total: 151 mg/dL (ref 100–199)
HDL: 37 mg/dL — ABNORMAL LOW (ref >39)
LDL Chol Calc (NIH): 100 mg/dL — ABNORMAL HIGH (ref 0–99)
LDL/HDL Ratio: 2.7 ratio (ref 0.0–3.6)
Triglycerides: 72 mg/dL (ref 0–149)
VLDL Cholesterol Cal: 14 mg/dL (ref 5–40)

## 2019-09-30 LAB — HIV ANTIBODY (ROUTINE TESTING W REFLEX): HIV Screen 4th Generation wRfx: NONREACTIVE

## 2019-09-30 LAB — HEMOGLOBIN A1C
Est. average glucose Bld gHb Est-mCnc: 105 mg/dL
Hgb A1c MFr Bld: 5.3 % (ref 4.8–5.6)

## 2019-09-30 LAB — TSH: TSH: 0.979 u[IU]/mL (ref 0.450–4.500)

## 2019-09-30 LAB — HEPATITIS C ANTIBODY: Hep C Virus Ab: <0.1 s/co ratio (ref 0.0–0.9)

## 2019-10-02 ENCOUNTER — Telehealth: Payer: Self-pay

## 2019-10-02 NOTE — Telephone Encounter (Signed)
-----   Message from Margaretann Loveless, [place] sent at 10/02/2019  7:29 AM EDT ----- Blood count is normal. Kidney and liver function are normal. Sodium, potassium, and calcium are normal. Thyroid is normal. Cholesterol is normal. Sugar/A1c are normal. Hepatitis C screen is negative. HIV screen is negative.

## 2019-10-02 NOTE — Telephone Encounter (Signed)
Written by Margaretann Loveless, PA-C on 10/02/2019 7:29 AM EDT Seen by patient Benjamin Adams on 10/02/2019 7:29 AM

## 2019-10-06 ENCOUNTER — Ambulatory Visit: Payer: BC Managed Care – PPO | Admitting: Physician Assistant

## 2019-10-06 ENCOUNTER — Encounter: Payer: Self-pay | Admitting: Physician Assistant

## 2019-10-06 ENCOUNTER — Other Ambulatory Visit: Payer: Self-pay

## 2019-10-06 VITALS — BP 115/80 | HR 87 | Temp 98.6°F | Resp 16 | Wt 268.8 lb

## 2019-10-06 DIAGNOSIS — R5383 Other fatigue: Secondary | ICD-10-CM

## 2019-10-06 NOTE — Patient Instructions (Signed)
Dehydration, Adult Dehydration is a condition in which there is not enough water or other fluids in the body. This happens when a person loses more fluids than he or she takes in. Important organs, such as the kidneys, brain, and heart, cannot function without a proper amount of fluids. Any loss of fluids from the body can lead to dehydration. Dehydration can be mild, moderate, or severe. It should be treated right away to prevent it from becoming severe. What are the causes? Dehydration may be caused by:  Conditions that cause loss of water or other fluids, such as diarrhea, vomiting, or sweating or urinating a lot.  Not drinking enough fluids, especially when you are ill or doing activities that require a lot of energy.  Other illnesses and conditions, such as fever or infection.  Certain medicines, such as medicines that remove excess fluid from the body (diuretics).  Lack of safe drinking water.  Not being able to get enough water and food. What increases the risk? The following factors may make you more likely to develop this condition:  Having a long-term (chronic) illness that has not been treated properly, such as diabetes, heart disease, or kidney disease.  Being 65 years of age or older.  Having a disability.  Living in a place that is high in altitude, where thinner, drier air causes more fluid loss.  Doing exercises that put stress on your body for a long time (endurance sports). What are the signs or symptoms? Symptoms of dehydration depend on how severe it is. Mild or moderate dehydration  Thirst.  Dry lips or dry mouth.  Dizziness or light-headedness, especially when standing up from a seated position.  Muscle cramps.  Dark urine. Urine may be the color of tea.  Less urine or tears produced than usual.  Headache. Severe dehydration  Changes in skin. Your skin may be cold and clammy, blotchy, or pale. Your skin also may not return to normal after being  lightly pinched and released.  Little or no tears, urine, or sweat.  Changes in vital signs, such as rapid breathing and low blood pressure. Your pulse may be weak or may be faster than 100 beats a minute when you are sitting still.  Other changes, such as: ? Feeling very thirsty. ? Sunken eyes. ? Cold hands and feet. ? Confusion. ? Being very tired (lethargic) or having trouble waking from sleep. ? Short-term weight loss. ? Loss of consciousness. How is this diagnosed? This condition is diagnosed based on your symptoms and a physical exam. You may have blood and urine tests to help confirm the diagnosis. How is this treated? Treatment for this condition depends on how severe it is. Treatment should be started right away. Do not wait until dehydration becomes severe. Severe dehydration is an emergency and needs to be treated in a hospital.  Mild or moderate dehydration can be treated at home. You may be asked to: ? Drink more fluids. ? Drink an oral rehydration solution (ORS). This drink helps restore proper amounts of fluids and salts and minerals in the blood (electrolytes).  Severe dehydration can be treated: ? With IV fluids. ? By correcting abnormal levels of electrolytes. This is often done by giving electrolytes through a tube that is passed through your nose and into your stomach (nasogastric tube, or NG tube). ? By treating the underlying cause of dehydration. Follow these instructions at home: Oral rehydration solution If told by your health care provider, drink an ORS:  Make   an ORS by following instructions on the package.  Start by drinking small amounts, about  cup (120 mL) every 5-10 minutes.  Slowly increase how much you drink until you have taken the amount recommended by your health care provider. Eating and drinking         Drink enough clear fluid to keep your urine pale yellow. If you were told to drink an ORS, finish the ORS first and then start slowly  drinking other clear fluids. Drink fluids such as: ? Water. Do not drink only water. Doing that can lead to hyponatremia, which is having too little salt (sodium) in the body. ? Water from ice chips you suck on. ? Fruit juice that you have added water to (diluted fruit juice). ? Low-calorie sports drinks.  Eat foods that contain a healthy balance of electrolytes, such as bananas, oranges, potatoes, tomatoes, and spinach.  Do not drink alcohol.  Avoid the following: ? Drinks that contain a lot of sugar. These include high-calorie sports drinks, fruit juice that is not diluted, and soda. ? Caffeine. ? Foods that are greasy or contain a lot of fat or sugar. General instructions  Take over-the-counter and prescription medicines only as told by your health care provider.  Do not take sodium tablets. Doing that can lead to having too much sodium in the body (hypernatremia).  Return to your normal activities as told by your health care provider. Ask your health care provider what activities are safe for you.  Keep all follow-up visits as told by your health care provider. This is important. Contact a health care provider if:  You have muscle cramps, pain, or discomfort, such as: ? Pain in your abdomen and the pain gets worse or stays in one area (localizes). ? Stiff neck.  You have a rash.  You are more irritable than usual.  You are sleepier or have a harder time waking than usual.  You feel weak or dizzy.  You feel very thirsty. Get help right away if you have:  Any symptoms of severe dehydration.  Symptoms of vomiting, such as: ? You cannot eat or drink without vomiting. ? Vomiting gets worse or does not go away. ? Vomit includes blood or green matter (bile).  Symptoms that get worse with treatment.  A fever.  A severe headache.  Problems with urination or bowel movements, such as: ? Diarrhea that gets worse or does not go away. ? Blood in your stool (feces). This  may cause stool to look black and tarry. ? Not urinating, or urinating only a small amount of very dark urine, within 6-8 hours.  Trouble breathing. These symptoms may represent a serious problem that is an emergency. Do not wait to see if the symptoms will go away. Get medical help right away. Call your local emergency services (911 in the [place] Do not drive yourself to the hospital. Summary  Dehydration is a condition in which there is not enough water or other fluids in the body. This happens when a person loses more fluids than he or she takes in.  Treatment for this condition depends on how severe it is. Treatment should be started right away. Do not wait until dehydration becomes severe.  Drink enough clear fluid to keep your urine pale yellow. If you were told to drink an oral rehydration solution (ORS), finish the ORS first and then start slowly drinking other clear fluids.  Take over-the-counter and prescription medicines only as told by your health care   provider.  Get help right away if you have any symptoms of severe dehydration. This information is not intended to replace advice given to you by your health care provider. Make sure you discuss any questions you have with your health care provider. Document Revised: 09/08/2018 Document Reviewed: 09/08/2018 Elsevier Patient Education  2020 Elsevier Inc[employer]   

## 2019-10-06 NOTE — Progress Notes (Signed)
Established patient visit   Patient: Benjamin Adams   DOB: Aug 05, 1980   39 y.o. Male  MRN: [id number] Visit Date: 10/06/2019  Today's healthcare provider: Margaretann Loveless, PA-C   Chief Complaint  Patient presents with  . Fatigue   Subjective    HPI  Fatigue: patient reports that he feels very fatigue for the last 2 weeks. He states they are very short staffed at work and he just feels exhausted. Feels like he is dragging. Recently had labs that were normal. Feels he is just being overworked and does not have enough time to rest. Feels if he could sleep he would feel better. Has been working almost 7 days weekly and 10-12 hours each day since the beginning of the pandemic as they have been short-handed at work. He also reports that with the extra days and hours he is also in a very hot and humid area and dehydration may be playing some. He does report good water intake (around a gallon daily) but may be lacking in electrolytes. He feels overall he sleeps well at night.   Patient Active Problem List   Diagnosis Date Noted  . Migraine with aura 10/02/2014  . Allergic rhinitis 10/02/2014  . Adiposity 10/02/2014   Past Medical History:  Diagnosis Date  . Asthma   . Hypoglycemia        Medications: Outpatient Medications Prior to Visit  Medication Sig  . fluticasone (FLONASE) 50 MCG/ACT nasal spray Place 2 sprays into both nostrils daily.  . Loratadine-Pseudoephedrine (CLARITIN-D 24 HOUR PO) Take by mouth.  . ondansetron (ZOFRAN ODT) 4 MG disintegrating tablet Take 1 tablet (4 mg total) by mouth every 8 (eight) hours as needed.   No facility-administered medications prior to visit.    Review of Systems  Constitutional: Positive for fatigue.  Respiratory: Negative.   Cardiovascular: Negative.   Neurological: Positive for weakness.    Last CBC Lab Results  Component Value Date   WBC 5.6 09/29/2019   HGB 14.6 09/29/2019   HCT 44.0 09/29/2019   MCV 97 09/29/2019     MCH 32.2 09/29/2019   RDW 13.2 09/29/2019   PLT 200 09/29/2019   Last metabolic panel Lab Results  Component Value Date   GLUCOSE 86 09/29/2019   NA 141 09/29/2019   K 4.7 09/29/2019   CL 107 (H) 09/29/2019   CO2 19 (L) 09/29/2019   BUN 17 09/29/2019   CREATININE 0.76 09/29/2019   GFRNONAA 116 09/29/2019   GFRAA 134 09/29/2019   CALCIUM 8.8 09/29/2019   PROT 6.5 09/29/2019   ALBUMIN 4.3 09/29/2019   LABGLOB 2.2 09/29/2019   AGRATIO 2.0 09/29/2019   BILITOT 0.4 09/29/2019   ALKPHOS 83 09/29/2019   AST 17 09/29/2019   ALT 16 09/29/2019   ANIONGAP 3 (L) 04/17/2019      Objective    BP 115/80 (BP Location: Left Arm, Patient Position: Sitting, Cuff Size: Large)   Pulse 87   Temp 98.6 F (37 C) (Oral)   Resp 16   Wt 268 lb 12.8 oz (121.9 kg)   BMI 40.87 kg/m  BP Readings from Last 3 Encounters:  10/06/19 115/80  09/28/19 111/73  04/17/19 101/67   Wt Readings from Last 3 Encounters:  10/06/19 268 lb 12.8 oz (121.9 kg)  09/28/19 271 lb 12.8 oz (123.3 kg)  04/17/19 250 lb (113.4 kg)      Physical Exam Vitals reviewed.  Constitutional:      General: He is  not in acute distress.    Appearance: He is well-developed. He is obese. He is ill-appearing. He is not diaphoretic.     Comments: Appears very fatigued  HENT:     Head: Normocephalic and atraumatic.  Cardiovascular:     Rate and Rhythm: Normal rate and regular rhythm.     Heart sounds: Normal heart sounds. No murmur heard.  No friction rub. No gallop.   Pulmonary:     Effort: Pulmonary effort is normal. No respiratory distress.     Breath sounds: Normal breath sounds. No wheezing or rales.  Musculoskeletal:     Cervical back: Normal range of motion and neck supple.  Neurological:     Mental Status: He is alert.       No results found for any visits on 10/06/19.  Assessment & Plan     1. Fatigue, unspecified type EKG today shows NSR rate of 69 without irregularities or ST segment changes. Labs  done just 2 weeks ago were normal. Will give work note for rest. Hydrate well, including drinks with electrolytes, while home. Call if worsening.  - EKG 12-Lead   No follow-ups on file.      Delmer Islam, PA-C, have reviewed all documentation for this visit. The documentation on 10/10/19 for the exam, diagnosis, procedures, and orders are all accurate and complete.   Reine Just  [facility] [phone] (phone) [phone] (fax)  [facility]

## 2019-10-10 ENCOUNTER — Encounter: Payer: Self-pay | Admitting: Physician Assistant

## 2019-10-17 ENCOUNTER — Encounter: Payer: Self-pay | Admitting: Family Medicine

## 2020-02-22 ENCOUNTER — Encounter: Payer: Self-pay | Admitting: Family Medicine

## 2020-02-22 ENCOUNTER — Ambulatory Visit (INDEPENDENT_AMBULATORY_CARE_PROVIDER_SITE_OTHER): Payer: BC Managed Care – PPO | Admitting: Family Medicine

## 2020-02-22 ENCOUNTER — Other Ambulatory Visit: Payer: Self-pay

## 2020-02-22 DIAGNOSIS — R5383 Other fatigue: Secondary | ICD-10-CM | POA: Diagnosis not present

## 2020-02-22 DIAGNOSIS — J019 Acute sinusitis, unspecified: Secondary | ICD-10-CM | POA: Diagnosis not present

## 2020-02-22 DIAGNOSIS — R5381 Other malaise: Secondary | ICD-10-CM

## 2020-02-22 DIAGNOSIS — R059 Cough, unspecified: Secondary | ICD-10-CM | POA: Diagnosis not present

## 2020-02-22 MED ORDER — AMOXICILLIN 875 MG PO TABS: 875.0000 mg | ORAL_TABLET | Freq: Two times a day (BID) | ORAL | 0 refills | Status: DC

## 2020-02-22 MED ORDER — BENZONATATE 100 MG PO CAPS: 100.0000 mg | ORAL_CAPSULE | Freq: Two times a day (BID) | ORAL | 0 refills | Status: DC | PRN

## 2020-02-22 NOTE — Progress Notes (Signed)
Virtual telephone visit    Virtual Visit via Telephone Note   This visit type was conducted due to national recommendations for restrictions regarding the COVID-19 Pandemic (e.g. social distancing) in an effort to limit this patient's exposure and mitigate transmission in our community. Due to his co-morbid illnesses, this patient is at least at moderate risk for complications without adequate follow up. This format is felt to be most appropriate for this patient at this time. The patient did not have access to video technology or had technical difficulties with video requiring transitioning to audio format only (telephone). Physical exam was limited to content and character of the telephone converstion.    Patient location: home Provider location: office  I discussed the limitations of evaluation and management by telemedicine and the availability of in person appointments. The patient expressed understanding and agreed to proceed.   Visit Date: 02/22/2020  Today's healthcare provider: Dortha Kern, PA-C   Chief Complaint  Patient presents with  . Fever   Subjective    Fever  This is a new (patient states that he took a rapid covid test yesterday that was negative) problem. The current episode started yesterday. The problem has been gradually improving. The maximum temperature noted was 100 to 100.9 F. The temperature was taken using an oral thermometer. Associated symptoms include congestion, coughing, ear pain, headaches, sleepiness and a sore throat. Pertinent negatives include no abdominal pain, chest pain, diarrhea, muscle aches, nausea, rash, urinary pain, vomiting or wheezing. Treatments tried: Robitussin and Nyquil. The treatment provided mild relief.     Past Medical History:  Diagnosis Date  . Asthma   . Hypoglycemia    Past Surgical History:  Procedure Laterality Date  . TONSILLECTOMY AND ADENOIDECTOMY    . WISDOM TOOTH EXTRACTION     Social History    Tobacco Use  . Smoking status: Never Smoker  . Smokeless tobacco: Never Used  Substance Use Topics  . Alcohol use: Not Currently    Alcohol/week: 0.0 standard drinks  . Drug use: No   Family History  Problem Relation Age of Onset  . Diabetes Mother   . Hyperlipidemia Mother   . Hypertension Mother   . Hyperlipidemia Father   . Diabetes Father   . Cholecystitis Father   . Healthy Brother    Allergies  Allergen Reactions  . Albuterol   . Azithromycin   . Cephalexin     Other reaction(s): Vomiting  . Ciprofloxacin     makes skin feel like it is burning  . Clarithromycin Swelling  . Clavulanic Acid Itching  . Codeine     Other reaction(s): Stomach Ache, Unconsciousness  . Erythromycin   . Fluocinolone Itching  . Nitrofurantoin Monohyd Macro Itching  . Qvar  [Beclomethasone]     QVAR causes wheezing  . Sulfa Antibiotics     rash      Medications: Outpatient Medications Prior to Visit  Medication Sig  . [DISCONTINUED] fluticasone (FLONASE) 50 MCG/ACT nasal spray Place 2 sprays into both nostrils daily.  . [DISCONTINUED] Loratadine-Pseudoephedrine (CLARITIN-D 24 HOUR PO) Take by mouth.  . [DISCONTINUED] ondansetron (ZOFRAN ODT) 4 MG disintegrating tablet Take 1 tablet (4 mg total) by mouth every 8 (eight) hours as needed.   No facility-administered medications prior to visit.    Review of Systems  Constitutional: Positive for fever.  HENT: Positive for congestion, ear pain and sore throat.   Respiratory: Positive for cough. Negative for wheezing.   Cardiovascular: Negative for chest pain.  Gastrointestinal: Negative for abdominal pain, diarrhea, nausea and vomiting.  Genitourinary: Negative for dysuria.  Skin: Negative for rash.  Neurological: Positive for headaches.      Objective    There were no vitals taken for this visit.  During telephonic interview, no acute respiratory distress. Some coughing but no wheezing.  Assessment & Plan     1. Malaise  and fatigue Onset the past couple days with malaise and fatigue. Denies fever today and had a negative rapid COVID test yesterday. Maintain isolation and COVID restrictions. Use OTC meds for symptomatic relief and get COVID test with influenza test tomorrow at 4:30 pm.  - Coronavirus (COVID-19) with Influenza A and Influenza B  2. Cough Onset with sinus congestion and fever yesterday. Negative rapid COVID test but symptoms are persisting. No fever today. Schedule COVID and influenza test. Use Tessalon Perles for cough control. - benzonatate (TESSALON) 100 MG capsule; Take 1 capsule (100 mg total) by mouth 2 (two) times daily as needed for cough.  Dispense: 20 capsule; Refill: 0 - Coronavirus (COVID-19) with Influenza A and Influenza B  3. Acute non-recurrent sinusitis, unspecified location Onset with PND and sinus pressure/headache over the past 2-3 days. Continue Nyquil Sinus Cold. Increase fluids and add antibiotic. Recheck prn. - amoxicillin (AMOXIL) 875 MG tablet; Take 1 tablet (875 mg total) by mouth 2 (two) times daily.  Dispense: 20 tablet; Refill: 0   No follow-ups on file.    I discussed the assessment and treatment plan with the patient. The patient was provided an opportunity to ask questions and all were answered. The patient agreed with the plan and demonstrated an understanding of the instructions.   The patient was advised to call back or seek an in-person evaluation if the symptoms worsen or if the condition fails to improve as anticipated.  I provided 20 minutes of non-face-to-face time during this encounter.  I, Audray Rumore, PA-C, have reviewed all documentation for this visit. The documentation on 02/22/20 for the exam, diagnosis, procedures, and orders are all accurate and complete.   Dortha Kern, PA-C [facility] [phone] (phone) [phone] (fax)  [facility]

## 2020-02-23 DIAGNOSIS — R059 Cough, unspecified: Secondary | ICD-10-CM | POA: Diagnosis not present

## 2020-02-23 DIAGNOSIS — R5381 Other malaise: Secondary | ICD-10-CM | POA: Diagnosis not present

## 2020-02-23 DIAGNOSIS — R5383 Other fatigue: Secondary | ICD-10-CM | POA: Diagnosis not present

## 2020-02-27 LAB — COVID-19, FLU A+B NAA
Influenza A, NAA: NOT DETECTED
Influenza B, NAA: NOT DETECTED
SARS-CoV-2, NAA: DETECTED — AB

## 2020-02-27 LAB — SPECIMEN STATUS REPORT

## 2020-02-28 ENCOUNTER — Telehealth: Payer: Self-pay

## 2020-02-28 NOTE — Telephone Encounter (Signed)
If not free of fever the past 48-72 hours without taking a fever reducing medication, he should still be in quarantine at home.

## 2020-02-28 NOTE — Telephone Encounter (Signed)
  Patient advised   Copied from CRM [id number]. Topic: General - Other >> Feb 28, 2020  8:35 AM Gwenlyn Fudge wrote: Reason for CRM: Pt calling stating that he tested positive for covid 5 days ago. He states that he is still experiencing cough, fever, fatigue. He states that his job is pushing him to come back to work, but that he is not ready and is requesting to know what he should do. Please advise.

## 2020-02-29 ENCOUNTER — Telehealth: Payer: Self-pay

## 2020-02-29 ENCOUNTER — Encounter: Payer: Self-pay | Admitting: Family Medicine

## 2020-02-29 NOTE — Telephone Encounter (Signed)
Copied from CRM [id number]. Topic: General - Other >> Feb 29, 2020  9:18 AM Gwenlyn Fudge wrote: Reason for CRM: Pt calling and is requesting to have a note for work stating that it is okay to go back after covid. Please advise.

## 2020-02-29 NOTE — Telephone Encounter (Signed)
Sent to him by MyChart.

## 2020-04-04 ENCOUNTER — Telehealth (INDEPENDENT_AMBULATORY_CARE_PROVIDER_SITE_OTHER): Payer: BC Managed Care – PPO | Admitting: Physician Assistant

## 2020-04-04 DIAGNOSIS — R5383 Other fatigue: Secondary | ICD-10-CM

## 2020-04-04 DIAGNOSIS — R112 Nausea with vomiting, unspecified: Secondary | ICD-10-CM | POA: Diagnosis not present

## 2020-04-04 MED ORDER — PROMETHAZINE HCL 12.5 MG PO TABS: 12.5000 mg | ORAL_TABLET | Freq: Three times a day (TID) | ORAL | 0 refills | Status: DC | PRN

## 2020-04-04 NOTE — Progress Notes (Signed)
MyChart Video Visit    Virtual Visit via Video Note   This visit type was conducted due to national recommendations for restrictions regarding the COVID-19 Pandemic (e.g. social distancing) in an effort to limit this patient's exposure and mitigate transmission in our community. This patient is at least at moderate risk for complications without adequate follow up. This format is felt to be most appropriate for this patient at this time. Physical exam was limited by quality of the video and audio technology used for the visit.   Patient location: Home Provider location: Office   I discussed the limitations of evaluation and management by telemedicine and the availability of in person appointments. The patient expressed understanding and agreed to proceed.  Patient: Benjamin Adams   DOB: [birth year]   40 y.o. Male  MRN: [id number] Visit Date: 04/04/2020  Today's healthcare provider: Trey Sailors, PA-C   Chief Complaint  Patient presents with  . URI  I,Edd Reppert M Meela Wareing,acting as a scribe for [employer], PA-C.,have documented all relevant documentation on the behalf of Trey Sailors, PA-C,as directed by  Trey Sailors, PA-C while in the presence of Trey Sailors, PA-C.  Subjective    URI  This is a new problem. The current episode started today. The problem has been unchanged. There has been no fever. Associated symptoms include diarrhea, ear pain, headaches, nausea and vomiting. Pertinent negatives include no congestion, coughing, rhinorrhea, sinus pain, sneezing, sore throat or wheezing. He has tried increased fluids for the symptoms. The treatment provided no relief.        Medications: Outpatient Medications Prior to Visit  Medication Sig  . amoxicillin (AMOXIL) 875 MG tablet Take 1 tablet (875 mg total) by mouth 2 (two) times daily. (Patient not taking: Reported on 04/04/2020)  . benzonatate (TESSALON) 100 MG capsule Take 1 capsule (100 mg total) by mouth 2  (two) times daily as needed for cough. (Patient not taking: Reported on 04/04/2020)   No facility-administered medications prior to visit.    Review of Systems  Constitutional: Positive for chills. Negative for appetite change, fatigue and fever.  HENT: Positive for ear pain. Negative for congestion, postnasal drip, rhinorrhea, sinus pressure, sinus pain, sneezing and sore throat.   Respiratory: Negative for cough, chest tightness, shortness of breath and wheezing.   Gastrointestinal: Positive for diarrhea, nausea and vomiting.  Neurological: Positive for dizziness, light-headedness and headaches. Negative for weakness.      Objective    There were no vitals taken for this visit.   Physical Exam Constitutional:      Appearance: Normal appearance.  Pulmonary:     Effort: Pulmonary effort is normal. No respiratory distress.  Neurological:     Mental Status: He is alert.  Psychiatric:        Mood and Affect: Mood normal.        Behavior: Behavior normal.        Assessment & Plan    1. Other fatigue  Suspect viral syndrome, counseled on likely self limiting nature. Offered further covid/flu testing. Patient would like to try rapid test.   2. Nausea and vomiting, intractability of vomiting not specified, unspecified vomiting type  - promethazine (PHENERGAN) 12.5 MG tablet; Take 1 tablet (12.5 mg total) by mouth every 8 (eight) hours as needed for nausea or vomiting.  Dispense: 20 tablet; Refill: 0   No follow-ups on file.     I discussed the assessment and treatment plan with the patient.  The patient was provided an opportunity to ask questions and all were answered. The patient agreed with the plan and demonstrated an understanding of the instructions.   The patient was advised to call back or seek an in-person evaluation if the symptoms worsen or if the condition fails to improve as anticipated.   ITrey Sailors, PA-C, have reviewed all documentation for this  visit. The documentation on 04/05/20 for the exam, diagnosis, procedures, and orders are all accurate and complete.  The entirety of the information documented in the History of Present Illness, Review of Systems and Physical Exam were personally obtained by me. Portions of this information were initially documented by [facility] and reviewed by me for thoroughness and accuracy.    Maryella Shivers [facility] [phone] (phone) [phone] (fax)  [facility]

## 2020-07-22 ENCOUNTER — Encounter: Payer: Self-pay | Admitting: Emergency Medicine

## 2020-07-22 ENCOUNTER — Emergency Department
Admission: EM | Admit: 2020-07-22 | Discharge: 2020-07-22 | Disposition: A | Discharge status: Home / Self Care | Payer: BC Managed Care – PPO | Attending: Emergency Medicine | Admitting: Emergency Medicine

## 2020-07-22 ENCOUNTER — Other Ambulatory Visit: Payer: Self-pay

## 2020-07-22 DIAGNOSIS — J45909 Unspecified asthma, uncomplicated: Secondary | ICD-10-CM | POA: Insufficient documentation

## 2020-07-22 DIAGNOSIS — H9202 Otalgia, left ear: Secondary | ICD-10-CM | POA: Diagnosis not present

## 2020-07-22 DIAGNOSIS — H8112 Benign paroxysmal vertigo, left ear: Secondary | ICD-10-CM | POA: Diagnosis not present

## 2020-07-22 DIAGNOSIS — H6692 Otitis media, unspecified, left ear: Secondary | ICD-10-CM | POA: Diagnosis not present

## 2020-07-22 DIAGNOSIS — R000 Tachycardia, unspecified: Secondary | ICD-10-CM | POA: Diagnosis not present

## 2020-07-22 DIAGNOSIS — H6690 Otitis media, unspecified, unspecified ear: Secondary | ICD-10-CM

## 2020-07-22 LAB — CBC
HCT: 44.4 % (ref 39.0–52.0)
Hemoglobin: 15.3 g/dL (ref 13.0–17.0)
MCH: 31.9 pg (ref 26.0–34.0)
MCHC: 34.5 g/dL (ref 30.0–36.0)
MCV: 92.7 fL (ref 80.0–100.0)
Platelets: 206 10*3/uL (ref 150–400)
RBC: 4.79 MIL/uL (ref 4.22–5.81)
RDW: 13 % (ref 11.5–15.5)
WBC: 10.6 10*3/uL — ABNORMAL HIGH (ref 4.0–10.5)
nRBC: 0 % (ref 0.0–0.2)

## 2020-07-22 LAB — BASIC METABOLIC PANEL
Anion gap: 8 (ref 5–15)
BUN: 14 mg/dL (ref 6–20)
CO2: 25 mmol/L (ref 22–32)
Calcium: 9.1 mg/dL (ref 8.9–10.3)
Chloride: 106 mmol/L (ref 98–111)
Creatinine, Ser: 0.86 mg/dL (ref 0.61–1.24)
GFR, Estimated: >60 mL/min (ref >60)
Glucose, Bld: 117 mg/dL — ABNORMAL HIGH (ref 70–99)
Potassium: 4.9 mmol/L (ref 3.5–5.1)
Sodium: 139 mmol/L (ref 135–145)

## 2020-07-22 LAB — URINALYSIS, COMPLETE (UACMP) WITH MICROSCOPIC
Bacteria, UA: NONE SEEN
Bilirubin Urine: NEGATIVE
Glucose, UA: NEGATIVE mg/dL
Hgb urine dipstick: NEGATIVE
Ketones, ur: NEGATIVE mg/dL
Leukocytes,Ua: NEGATIVE
Nitrite: NEGATIVE
Protein, ur: NEGATIVE mg/dL
Specific Gravity, Urine: 1.019 (ref 1.005–1.030)
Squamous Epithelial / HPF: NONE SEEN (ref 0–5)
pH: 5 (ref 5.0–8.0)

## 2020-07-22 MED ORDER — MECLIZINE HCL 25 MG PO TABS: 25.0000 mg | ORAL_TABLET | Freq: Three times a day (TID) | ORAL | 0 refills | Status: DC | PRN

## 2020-07-22 MED ORDER — AMOXICILLIN 500 MG PO TABS: 1000.0000 mg | ORAL_TABLET | Freq: Two times a day (BID) | ORAL | 0 refills | Status: AC

## 2020-07-22 NOTE — ED Triage Notes (Signed)
Pt via POV from home. Pt here for multiple complaints. otalgia for the past week, headache, dizziness, and emesis. Pt states he had multiple ear infections when he was younger, and pt states he also has a hx of migraines. Pt is A&Ox4 and NAD. Pt had a episode of vomiting during triage.

## 2020-07-22 NOTE — ED Notes (Signed)
Blood Glucose is 117

## 2020-07-22 NOTE — ED Provider Notes (Signed)
[facility] Emergency Department Provider Note   ____________________________________________   Event Date/Time   First MD Initiated Contact with Patient 07/22/20 1636     (approximate)  I have reviewed the triage vital signs and the nursing notes.   HISTORY  Chief Complaint Dizziness and Otalgia    HPI Benjamin Adams is a 40 y.o. male with past medical history of asthma and migraines who presents to the ED complaining of ear pain.  Patient reports approximately 1 week of increasing pain to his left ear which starts inside the ear and goes just underneath his left jaw.  He states he initially thought this could be related to a dental issue and he had multiple cavities filled by his dentist, but continues to have pain.  He feels like his left ear is frequently popping and it is painful when he moves his jaw.  He has not noticed any redness or swelling from the ear and denies any drainage.  He denies any fevers or facial swelling.  He states he has dealt with frequent ear infections in the past and current symptoms feel similar.  He additionally states he has been feeling dizzy at times, especially when he goes to stand up and move around.  He describes this as a feeling of the room spinning around him, denies any lightheadedness or near syncope.        Past Medical History:  Diagnosis Date   Asthma    Hypoglycemia     Patient Active Problem List   Diagnosis Date Noted   Migraine with aura 10/02/2014   Allergic rhinitis 10/02/2014   Adiposity 10/02/2014    Past Surgical History:  Procedure Laterality Date   TONSILLECTOMY AND ADENOIDECTOMY     WISDOM TOOTH EXTRACTION      Prior to Admission medications   Medication Sig Start Date End Date Taking? Authorizing Provider  amoxicillin (AMOXIL) 500 MG tablet Take 2 tablets (1,000 mg total) by mouth 2 (two) times daily for 7 days. 07/22/20 07/29/20 Yes Chesley Noon, MD  meclizine (ANTIVERT) 25 MG tablet  Take 1 tablet (25 mg total) by mouth 3 (three) times daily as needed for dizziness. 07/22/20  Yes Chesley Noon, MD  benzonatate (TESSALON) 100 MG capsule Take 1 capsule (100 mg total) by mouth 2 (two) times daily as needed for cough. Patient not taking: Reported on 04/04/2020 02/22/20   Chrismon, Jodell Cipro, PA-C  promethazine (PHENERGAN) 12.5 MG tablet Take 1 tablet (12.5 mg total) by mouth every 8 (eight) hours as needed for nausea or vomiting. 04/04/20   Trey Sailors, PA-C    Allergies Albuterol, Azithromycin, Cephalexin, Ciprofloxacin, Clarithromycin, Clavulanic acid, Codeine, Erythromycin, Fluocinolone, Nitrofurantoin monohyd macro, Qvar  [beclomethasone], and Sulfa antibiotics  Family History  Problem Relation Age of Onset   Diabetes Mother    Hyperlipidemia Mother    Hypertension Mother    Hyperlipidemia Father    Diabetes Father    Cholecystitis Father    Healthy Brother     Social History Social History   Tobacco Use   Smoking status: Never   Smokeless tobacco: Never  Substance Use Topics   Alcohol use: Not Currently    Alcohol/week: 0.0 standard drinks   Drug use: No    Review of Systems  Constitutional: No fever/chills.  Positive for dizziness. Eyes: No visual changes. ENT: No sore throat.  Positive for left ear pain. Cardiovascular: Denies chest pain. Respiratory: Denies shortness of breath. Gastrointestinal: No abdominal pain.  No  nausea, no vomiting.  No diarrhea.  No constipation. Genitourinary: Negative for dysuria. Musculoskeletal: Negative for back pain. Skin: Negative for rash. Neurological: Negative for headaches, focal weakness or numbness.  ____________________________________________   PHYSICAL EXAM:  VITAL SIGNS: ED Triage Vitals  Enc Vitals Group     BP 07/22/20 1627 98/82     Pulse Rate 07/22/20 1627 79     Resp 07/22/20 1627 20     Temp 07/22/20 1627 97.9 F (36.6 C)     Temp Source 07/22/20 1627 Oral     SpO2 07/22/20 1627 99 %      Weight 07/22/20 1628 270 lb (122.5 kg)     Height 07/22/20 1628 5\' 2"  (1.575 m)     Head Circumference --      Peak Flow --      Pain Score 07/22/20 1628 9     Pain Loc --      Pain Edu? --      Excl. in GC? --     Constitutional: Alert and oriented. Eyes: Conjunctivae are normal. Head: Atraumatic. Nose: No congestion/rhinnorhea. Ears: Right TM clear, left TM bulging and erythematous.  No mastoid tenderness bilaterally. Mouth/Throat: Mucous membranes are moist.  Dentition intact without edema, erythema, or tenderness. Neck: Normal ROM Cardiovascular: Normal rate, regular rhythm. Grossly normal heart sounds. Respiratory: Normal respiratory effort.  No retractions. Lungs CTAB. Gastrointestinal: Soft and nontender. No distention. Genitourinary: deferred Musculoskeletal: No lower extremity tenderness nor edema. Neurologic:  Normal speech and language. No gross focal neurologic deficits are appreciated. Skin:  Skin is warm, dry and intact. No rash noted. Psychiatric: Mood and affect are normal. Speech and behavior are normal.  ____________________________________________   LABS (all labs ordered are listed, but only abnormal results are displayed)  Labs Reviewed  BASIC METABOLIC PANEL - Abnormal; Notable for the following components:      Result Value   Glucose, Bld 117 (*)    All other components within normal limits  CBC - Abnormal; Notable for the following components:   WBC 10.6 (*)    All other components within normal limits  URINALYSIS, COMPLETE (UACMP) WITH MICROSCOPIC - Abnormal; Notable for the following components:   Color, Urine YELLOW (*)    APPearance CLEAR (*)    All other components within normal limits  CBG MONITORING, ED   ____________________________________________  EKG  ED ECG REPORT I, 07/24/20, the attending physician, personally viewed and interpreted this ECG.   Date: 07/22/2020  EKG Time: 16:23  Rate: 102  Rhythm: sinus  tachycardia  Axis: Normal  Intervals:none  ST&T Change: None   PROCEDURES  Procedure(s) performed (including Critical Care):  Procedures   ____________________________________________   INITIAL IMPRESSION / ASSESSMENT AND PLAN / ED COURSE      40 year old male with past medical history of asthma and migraines who presents to the ED with 1 week of increasing pain to his left ear with intermittent dizziness when he stands up to move around.  Examination of left ear does appear concerning for otitis media and his dizziness sounds consistent with a peripheral vertigo.  Labs are unremarkable, EKG shows no evidence of arrhythmia or ischemia and I doubt cardiac etiology of his dizziness.  Trigeminal neuralgia was considered but seems less likely given the location of his pain.  We will treat with amoxicillin and meclizine, patient appropriate for discharge home with ENT and PCP follow-up.  He was counseled to return to the ED for new worsening symptoms, patient agrees with  plan.      ____________________________________________   FINAL CLINICAL IMPRESSION(S) / ED DIAGNOSES  Final diagnoses:  Acute otitis media, unspecified otitis media type  Benign paroxysmal positional vertigo of left ear     ED Discharge Orders          Ordered    amoxicillin (AMOXIL) 500 MG tablet  2 times daily        07/22/20 1803    meclizine (ANTIVERT) 25 MG tablet  3 times daily PRN        07/22/20 1803             Note:  This document was prepared using Dragon voice recognition software and may include unintentional dictation errors.    Chesley Noon, MD 07/22/20 1810

## 2020-09-04 ENCOUNTER — Emergency Department
Admission: EM | Admit: 2020-09-04 | Discharge: 2020-09-04 | Disposition: A | Discharge status: Home / Self Care | Payer: BC Managed Care – PPO | Attending: Emergency Medicine | Admitting: Emergency Medicine

## 2020-09-04 ENCOUNTER — Encounter: Payer: Self-pay | Admitting: *Deleted

## 2020-09-04 ENCOUNTER — Ambulatory Visit: Payer: Self-pay | Admitting: *Deleted

## 2020-09-04 DIAGNOSIS — J45909 Unspecified asthma, uncomplicated: Secondary | ICD-10-CM | POA: Insufficient documentation

## 2020-09-04 DIAGNOSIS — T7840XA Allergy, unspecified, initial encounter: Secondary | ICD-10-CM | POA: Insufficient documentation

## 2020-09-04 DIAGNOSIS — R221 Localized swelling, mass and lump, neck: Secondary | ICD-10-CM | POA: Diagnosis not present

## 2020-09-04 DIAGNOSIS — R0602 Shortness of breath: Secondary | ICD-10-CM | POA: Diagnosis not present

## 2020-09-04 MED ORDER — FAMOTIDINE 20 MG PO TABS
20.0000 mg | ORAL_TABLET | Freq: Once | ORAL | Status: AC
  Administered 2020-09-04: 20 mg via ORAL
  Filled 2020-09-04: qty 1

## 2020-09-04 MED ORDER — EPINEPHRINE 0.3 MG/0.3ML IJ SOAJ: 0.3000 mg | INTRAMUSCULAR | 1 refills | Status: AC | PRN

## 2020-09-04 MED ORDER — CEFDINIR 300 MG PO CAPS: 300.0000 mg | ORAL_CAPSULE | Freq: Two times a day (BID) | ORAL | 0 refills | Status: AC

## 2020-09-04 MED ORDER — PREDNISONE 20 MG PO TABS
60.0000 mg | ORAL_TABLET | Freq: Once | ORAL | Status: AC
  Administered 2020-09-04: 60 mg via ORAL
  Filled 2020-09-04: qty 3

## 2020-09-04 MED ORDER — PREDNISONE 20 MG PO TABS: 60.0000 mg | ORAL_TABLET | Freq: Every day | ORAL | 0 refills | Status: AC

## 2020-09-04 NOTE — Telephone Encounter (Signed)
Per agent: "Pt called in stating he has recently been to the dentist and they have put him on a new medication changed from amoxicillin and pt wanted to speak with someone about him being able to take the new medication with current medications. Please advise. "  Pt states had tooth extraction, infected, placed on amoxicillin. States changed to Clindamycin. Pt with multiple allergies. Calling to see if med was listed as allergy. Specific antibiotic is not but assured pt NT would route to practice for PCPs review to ensure that antibiotic is ok to take.Pt is allergic to other 'mycins.'  CB# [phone]  Reason for Disposition  [1] Caller has medicine question about med NOT prescribed by PCP AND [2] triager unable to answer question (e.g., compatibility with other med, storage)  Answer Assessment - Initial Assessment Questions 1. NAME of MEDICATION: "What medicine are you calling about?"     Clindamycin 2. QUESTION: "What is your question?" (e.g., double dose of medicine, side effect)     *No Answer* 3. PRESCRIBING HCP: "Who prescribed it?" Reason: if prescribed by specialist, call should be referred to that group.     *No Answer* 4. SYMPTOMS: "Do you have any symptoms?"     *No Answer* 5. SEVERITY: If symptoms are present, ask "Are they mild, moderate or severe?"     *No Answer* 6. PREGNANCY:  "Is there any chance that you are pregnant?" "When was your last menstrual period?"     *No Answer*  Protocols used: Medication Question Call-A-AH

## 2020-09-04 NOTE — ED Provider Notes (Signed)
[facility] Emergency Department Provider Note   ____________________________________________   Event Date/Time   First MD Initiated Contact with Patient 09/04/20 2133     (approximate)  I have reviewed the triage vital signs and the nursing notes.   HISTORY  Chief Complaint Allergic Reaction (Pt c/o throat swelling,shortness of breath, minor tongue swelling, lightheaded, generalized tingling sensation.)    HPI Benjamin Adams is a 40 y.o. male who presents for throat swelling  LOCATION: Throat DURATION: 1 hour prior to arrival TIMING: Stable since onset SEVERITY: Mild QUALITY: Swelling CONTEXT: Patient states that he just darted a new antibiotic and has a history of of allergic reactions to many antibiotics MODIFYING FACTORS: States he took 50 mg of oral Benadryl with no worsening of symptoms but no improvement ASSOCIATED SYMPTOMS: Shortness of breath and tongue swelling as well as the feeling of "burning"   Per medical record review, patient has history of multiple allergies to medications including multiple antibiotics          Past Medical History:  Diagnosis Date   Asthma    Hypoglycemia     Patient Active Problem List   Diagnosis Date Noted   Migraine with aura 10/02/2014   Allergic rhinitis 10/02/2014   Adiposity 10/02/2014    Past Surgical History:  Procedure Laterality Date   TONSILLECTOMY AND ADENOIDECTOMY     WISDOM TOOTH EXTRACTION      Prior to Admission medications   Medication Sig Start Date End Date Taking? Authorizing Provider  EPINEPHrine (EPIPEN 2-PAK) 0.3 mg/0.3 mL IJ SOAJ injection Inject 0.3 mg into the muscle as needed for anaphylaxis. 09/04/20  Yes Merwyn Katos, MD  predniSONE (DELTASONE) 20 MG tablet Take 3 tablets (60 mg total) by mouth daily with breakfast for 3 days. 09/04/20 09/07/20 Yes Cordarrell Sane, Clent Jacks, MD  benzonatate (TESSALON) 100 MG capsule Take 1 capsule (100 mg total) by mouth 2 (two) times daily  as needed for cough. Patient not taking: Reported on 04/04/2020 02/22/20   Chrismon, Jodell Cipro, PA-C  meclizine (ANTIVERT) 25 MG tablet Take 1 tablet (25 mg total) by mouth 3 (three) times daily as needed for dizziness. 07/22/20   Chesley Noon, MD  promethazine (PHENERGAN) 12.5 MG tablet Take 1 tablet (12.5 mg total) by mouth every 8 (eight) hours as needed for nausea or vomiting. 04/04/20   Trey Sailors, PA-C    Allergies Clindamycin/lincomycin, Albuterol, Azithromycin, Cephalexin, Ciprofloxacin, Clarithromycin, Clavulanic acid, Codeine, Erythromycin, Fluocinolone, Nitrofurantoin monohyd macro, Qvar  [beclomethasone], and Sulfa antibiotics  Family History  Problem Relation Age of Onset   Diabetes Mother    Hyperlipidemia Mother    Hypertension Mother    Hyperlipidemia Father    Diabetes Father    Cholecystitis Father    Healthy Brother     Social History Social History   Tobacco Use   Smoking status: Never   Smokeless tobacco: Never  Substance Use Topics   Alcohol use: Not Currently    Alcohol/week: 0.0 standard drinks   Drug use: No    Review of Systems Constitutional: No fever/chills Eyes: No visual changes. ENT: Endorses tongue and throat swelling Cardiovascular: Denies chest pain. Respiratory: Endorses shortness of breath. Gastrointestinal: No abdominal pain.  No nausea, no vomiting.  No diarrhea. Genitourinary: Negative for dysuria. Musculoskeletal: Negative for acute arthralgias Skin: Negative for rash. Neurological: Negative for headaches, weakness/numbness/paresthesias in any extremity Psychiatric: Negative for suicidal ideation/homicidal ideation   ____________________________________________   PHYSICAL EXAM:  VITAL SIGNS: ED Triage  Vitals [09/04/20 2131]  Enc Vitals Group     BP (!) 137/91     Pulse Rate 75     Resp 13     Temp 98.1 F (36.7 C)     Temp Source Oral     SpO2 100 %     Weight      Height      Head Circumference      Peak  Flow      Pain Score      Pain Loc      Pain Edu?      Excl. in GC?    Constitutional: Alert and oriented. Well appearing and in no acute distress. Eyes: Conjunctivae are normal. PERRL. Head: Atraumatic. Nose: No congestion/rhinnorhea. Mouth/Throat: Mucous membranes are moist.  Airway grossly patent with mild erythema and no tongue swelling Neck: No stridor Cardiovascular: Grossly normal heart sounds.  Good peripheral circulation. Respiratory: Normal respiratory effort.  No retractions. Gastrointestinal: Soft and nontender. No distention. Musculoskeletal: No obvious deformities Neurologic:  Normal speech and language. No gross focal neurologic deficits are appreciated. Skin:  Skin is warm and dry. No rash noted. Psychiatric: Mood and affect are normal. Speech and behavior are normal.  ____________________________________________   LABS (all labs ordered are listed, but only abnormal results are displayed)  Labs Reviewed - No data to display  PROCEDURES  Procedure(s) performed (including Critical Care):  .1-3 Lead EKG Interpretation  Date/Time: 09/04/2020 10:56 PM Performed by: Merwyn Katos, MD Authorized by: Merwyn Katos, MD     Interpretation: normal     ECG rate:  73   ECG rate assessment: normal     Rhythm: sinus rhythm     Ectopy: none     Conduction: normal     ____________________________________________   INITIAL IMPRESSION / ASSESSMENT AND PLAN / ED COURSE  As part of my medical decision making, I reviewed the following data within the electronic medical record, if available:  Nursing notes reviewed and incorporated, Labs reviewed, EKG interpreted, Old chart reviewed, Radiograph reviewed and Notes from prior ED visits reviewed and incorporated        + Tongue and throat swelling No evidence of multiorgan involvement  Given history and exam, presentation most consistent with allergic reaction. I have low suspicion for toxic shock syndrome,  anaphylaxis, asthma exacerbation, or drug toxicity. Rx: Prednisone 60mg  qday x3days, Benadryl 25mg  q8hr x3days Disposition: Discharge home with SRP. Follow up with PCP in 1-2 days.      ____________________________________________   FINAL CLINICAL IMPRESSION(S) / ED DIAGNOSES  Final diagnoses:  Allergic reaction to drug, initial encounter     ED Discharge Orders          Ordered    predniSONE (DELTASONE) 20 MG tablet  Daily with breakfast        09/04/20 2256    EPINEPHrine (EPIPEN 2-PAK) 0.3 mg/0.3 mL IJ SOAJ injection  As needed        09/04/20 2256             Note:  This document was prepared using Dragon voice recognition software and may include unintentional dictation errors.    09/06/20, MD 09/04/20 [phone]

## 2020-09-04 NOTE — Discharge Instructions (Addendum)
Please use 25-50 mg of Benadryl every 8 hours for the next 3 days Sure to take these new antibiotics on a full stomach for every dose

## 2020-09-04 NOTE — ED Triage Notes (Signed)
Pt started on clindamycin for tooth abscess.took first dose x 1 hr ago. Pt c/o throat swelling, difficulty swallowing, shortness of breath and generalized tingling. Pt is hoarse.No angioedema observed at this time. Pt denies pain at this time. Pt also c/o lightheadedness.

## 2020-09-06 NOTE — Telephone Encounter (Signed)
Left voice message on patient's phone that he should be OK to use the Clindamycin. He may call back if any further questions and should stay in contact with his dentist.

## 2021-01-29 ENCOUNTER — Ambulatory Visit: Payer: BC Managed Care – PPO | Admitting: Physician Assistant

## 2021-01-29 ENCOUNTER — Encounter: Payer: Self-pay | Admitting: Physician Assistant

## 2021-01-29 ENCOUNTER — Other Ambulatory Visit: Payer: Self-pay

## 2021-01-29 VITALS — BP 121/82 | HR 102 | Temp 98.3°F | Ht 62.0 in | Wt 310.0 lb

## 2021-01-29 DIAGNOSIS — R0981 Nasal congestion: Secondary | ICD-10-CM

## 2021-01-29 DIAGNOSIS — H66006 Acute suppurative otitis media without spontaneous rupture of ear drum, recurrent, bilateral: Secondary | ICD-10-CM

## 2021-01-29 DIAGNOSIS — R42 Dizziness and giddiness: Secondary | ICD-10-CM | POA: Diagnosis not present

## 2021-01-29 MED ORDER — AZELASTINE HCL 0.1 % NA SOLN: 2.0000 | Freq: Two times a day (BID) | NASAL | 12 refills | Status: DC

## 2021-01-29 MED ORDER — AMOXICILLIN 500 MG PO CAPS: 500.0000 mg | ORAL_CAPSULE | Freq: Three times a day (TID) | ORAL | 0 refills | Status: AC

## 2021-01-29 MED ORDER — MECLIZINE HCL 25 MG PO TABS: 25.0000 mg | ORAL_TABLET | Freq: Three times a day (TID) | ORAL | 0 refills | Status: DC | PRN

## 2021-01-29 NOTE — Progress Notes (Signed)
Established patient visit   Patient: Benjamin Adams   DOB: 10-Jul-1980   40 y.o. Male  MRN: [id number] Visit Date: 01/29/2021  Today's healthcare provider: Alfredia Ferguson, PA-C   Cc. Bilateral ear pain x 2 weeks  Subjective    HPI  Benjamin Adams is a 40 y/o male who presents today with bilateral ear pain and dizziness. He states he frequently gets ear infections and has seen ENT multiple times. Reports pain w/ cold air, pressure of face mask. Sudafed did not help, takes tylenol/advil alternating. Denies fevers/chills.    Medications: Outpatient Medications Prior to Visit  Medication Sig   EPINEPHrine (EPIPEN 2-PAK) 0.3 mg/0.3 mL IJ SOAJ injection Inject 0.3 mg into the muscle as needed for anaphylaxis.   benzonatate (TESSALON) 100 MG capsule Take 1 capsule (100 mg total) by mouth 2 (two) times daily as needed for cough. (Patient not taking: Reported on 04/04/2020)   promethazine (PHENERGAN) 12.5 MG tablet Take 1 tablet (12.5 mg total) by mouth every 8 (eight) hours as needed for nausea or vomiting.   [DISCONTINUED] meclizine (ANTIVERT) 25 MG tablet Take 1 tablet (25 mg total) by mouth 3 (three) times daily as needed for dizziness.   No facility-administered medications prior to visit.    Review of Systems     Objective    BP 121/82 (BP Location: Right Arm, Patient Position: Sitting, Cuff Size: Large)    Pulse (!) 102    Temp 98.3 F (36.8 C) (Oral)    Ht 5\' 2"  (1.575 m)    Wt (!) 310 lb (140.6 kg)    PF 97 L/min    BMI 56.70 kg/m  {Show previous vital signs (optional):23777}  Physical Exam Constitutional:      General: He is awake.     Appearance: He is well-developed.  HENT:     Head: Normocephalic.     Right Ear: Tympanic membrane is erythematous and bulging.     Left Ear: Tympanic membrane is erythematous and bulging.     Nose: Congestion and rhinorrhea present.     Mouth/Throat:     Pharynx: Posterior oropharyngeal erythema present. No oropharyngeal exudate.  Eyes:      Conjunctiva/sclera: Conjunctivae normal.  Cardiovascular:     Rate and Rhythm: Normal rate and regular rhythm.     Heart sounds: Normal heart sounds.  Pulmonary:     Effort: Pulmonary effort is normal.     Breath sounds: Normal breath sounds.  Skin:    General: Skin is warm.  Neurological:     Mental Status: He is alert and oriented to person, place, and time.  Psychiatric:        Attention and Perception: Attention normal.        Mood and Affect: Mood normal.        Speech: Speech normal.        Behavior: Behavior is cooperative.      No results found for any visits on 01/29/21.  Assessment & Plan     Bilateral AOM Rx amoxicillin 500 mg TID, pt has many medication allergies, but has taken amoxicillin in the past w/o issue Rx antivert to have during severe episodes of dizziness Rx azelastine nasal spray to help w/ congestion  Return if symptoms worsen or fail to improve.      I, 01/31/21, PA-C have reviewed all documentation for this visit. The documentation on  01/30/2021 for the exam, diagnosis, procedures, and orders are all accurate and complete.  Mikey Kirschner, PA-C  [facility] [phone] (phone) [phone] (fax)  [place]

## 2021-01-30 ENCOUNTER — Encounter: Payer: Self-pay | Admitting: Physician Assistant

## 2021-01-30 DIAGNOSIS — J019 Acute sinusitis, unspecified: Secondary | ICD-10-CM | POA: Diagnosis not present

## 2021-01-30 DIAGNOSIS — B9689 Other specified bacterial agents as the cause of diseases classified elsewhere: Secondary | ICD-10-CM | POA: Diagnosis not present

## 2021-01-30 DIAGNOSIS — R051 Acute cough: Secondary | ICD-10-CM | POA: Diagnosis not present

## 2021-02-11 DIAGNOSIS — J40 Bronchitis, not specified as acute or chronic: Secondary | ICD-10-CM | POA: Diagnosis not present

## 2021-02-11 DIAGNOSIS — U071 COVID-19: Secondary | ICD-10-CM | POA: Diagnosis not present

## 2021-02-11 DIAGNOSIS — Z03818 Encounter for observation for suspected exposure to other biological agents ruled out: Secondary | ICD-10-CM | POA: Diagnosis not present

## 2021-04-10 ENCOUNTER — Ambulatory Visit: Payer: BC Managed Care – PPO | Admitting: Physician Assistant

## 2021-04-10 ENCOUNTER — Telehealth: Payer: Self-pay | Admitting: Physician Assistant

## 2021-04-10 ENCOUNTER — Encounter: Payer: Self-pay | Admitting: Physician Assistant

## 2021-04-10 ENCOUNTER — Other Ambulatory Visit: Payer: Self-pay

## 2021-04-10 ENCOUNTER — Other Ambulatory Visit: Payer: Self-pay | Admitting: Physician Assistant

## 2021-04-10 VITALS — BP 117/69 | HR 88 | Wt 314.6 lb

## 2021-04-10 DIAGNOSIS — H01133 Eczematous dermatitis of right eye, unspecified eyelid: Secondary | ICD-10-CM | POA: Diagnosis not present

## 2021-04-10 DIAGNOSIS — R590 Localized enlarged lymph nodes: Secondary | ICD-10-CM | POA: Diagnosis not present

## 2021-04-10 MED ORDER — TRIAMCINOLONE ACETONIDE 0.025 % EX CREA: 1.0000 "application " | TOPICAL_CREAM | Freq: Two times a day (BID) | CUTANEOUS | 0 refills | Status: DC

## 2021-04-10 MED ORDER — HYDROCORTISONE VALERATE 0.2 % EX CREA: 1.0000 "application " | TOPICAL_CREAM | Freq: Two times a day (BID) | CUTANEOUS | 0 refills | Status: DC

## 2021-04-10 MED ORDER — PIMECROLIMUS 1 % EX CREA: TOPICAL_CREAM | Freq: Two times a day (BID) | CUTANEOUS | 0 refills | Status: DC

## 2021-04-10 MED ORDER — TACROLIMUS 0.1 % EX CREA: TOPICAL_CREAM | CUTANEOUS | 0 refills | Status: DC

## 2021-04-10 NOTE — Assessment & Plan Note (Signed)
Rx both a topical steroid and pimecrolimus cream. Advised caution with getting in eye, and to rinse eye repeatedly if this occurs. ? ?If no resolution in 2 weeks, ref to derm ?

## 2021-04-10 NOTE — Progress Notes (Signed)
? ?I,[name],acting as a Education administrator for [employer], PA-C.,have documented all relevant documentation on the behalf of Benjamin Kirschner, PA-C,as directed by  Benjamin Kirschner, PA-C while in the presence of Benjamin Kirschner, PA-C. ? ?Acute Office Visit ? ?Subjective:  ? ? Patient ID: Benjamin Adams, male    DOB: [birth year], 41 y.o.   MRN: [id number] ? ?Cc. Eyelid rash, swollen lymph node ? ? ?HPI ?Rob reports today with concerns over cracked, draining skin on his right eyelid x 2 months. Itchy, flaky. He had tried benadryl cream, antibiotic cream with no resolution. Denies eye pain/itching, changes in vision.  ? ?He reports for the last day, a swelling on the left side of his chin, with some pain to touch. Denies sore throat, nasal congestion, but does admit to seasonal allergies. Denies rash, fevers.  ? ?Past Medical History:  ?Diagnosis Date  ? Asthma   ? Hypoglycemia   ? ? ?Past Surgical History:  ?Procedure Laterality Date  ? TONSILLECTOMY AND ADENOIDECTOMY    ? WISDOM TOOTH EXTRACTION    ? ? ?Family History  ?Problem Relation Age of Onset  ? Diabetes Mother   ? Hyperlipidemia Mother   ? Hypertension Mother   ? Hyperlipidemia Father   ? Diabetes Father   ? Cholecystitis Father   ? Healthy Brother   ? ? ?Social History  ? ?Socioeconomic History  ? Marital status: Married  ?  Spouse name: Not on file  ? Number of children: Not on file  ? Years of education: Not on file  ? Highest education level: Not on file  ?Occupational History  ? Not on file  ?Tobacco Use  ? Smoking status: Never  ? Smokeless tobacco: Never  ?Substance and Sexual Activity  ? Alcohol use: Not Currently  ?  Alcohol/week: 0.0 standard drinks  ? Drug use: No  ? Sexual activity: Not on file  ?Other Topics Concern  ? Not on file  ?Social History Narrative  ? Not on file  ? ?Social Determinants of Health  ? ?Financial Resource Strain: Not on file  ?Food Insecurity: Not on file  ?Transportation Needs: Not on file  ?Physical Activity: Not on file   ?Stress: Not on file  ?Social Connections: Not on file  ?Intimate Partner Violence: Not on file  ? ? ?Outpatient Medications Prior to Visit  ?Medication Sig Dispense Refill  ? azelastine (ASTELIN) 0.1 % nasal spray Place 2 sprays into both nostrils 2 (two) times daily. Use in each nostril as directed 30 mL 12  ? benzonatate (TESSALON) 100 MG capsule Take 1 capsule (100 mg total) by mouth 2 (two) times daily as needed for cough. (Patient not taking: Reported on 04/04/2020) 20 capsule 0  ? EPINEPHrine (EPIPEN 2-PAK) 0.3 mg/0.3 mL IJ SOAJ injection Inject 0.3 mg into the muscle as needed for anaphylaxis. 1 each 1  ? meclizine (ANTIVERT) 25 MG tablet Take 1 tablet (25 mg total) by mouth 3 (three) times daily as needed for dizziness. 30 tablet 0  ? promethazine (PHENERGAN) 12.5 MG tablet Take 1 tablet (12.5 mg total) by mouth every 8 (eight) hours as needed for nausea or vomiting. 20 tablet 0  ? ?No facility-administered medications prior to visit.  ? ? ?Allergies  ?Allergen Reactions  ? Clindamycin/Lincomycin Shortness Of Breath  ? Albuterol   ? Azithromycin   ? Cephalexin   ?  Other reaction(s): Vomiting  ? Ciprofloxacin   ?  makes skin feel like it is burning  ? Clarithromycin Swelling  ?  Clavulanic Acid Itching  ? Codeine   ?  Other reaction(s): Stomach Ache, Unconsciousness  ? Erythromycin   ? Fluocinolone Itching  ? Nitrofurantoin Monohyd Macro Itching  ? Qvar  [Beclomethasone]   ?  QVAR causes wheezing  ? Sulfa Antibiotics   ?  rash  ? ? ?Review of Systems  ?Constitutional:  Negative for fatigue and fever.  ?HENT:    ?     Lymph node swelling  ?Eyes:  Negative for visual disturbance.  ?Respiratory:  Negative for cough and shortness of breath.   ?Cardiovascular:  Negative for chest pain, palpitations and leg swelling.  ?Skin:  Positive for color change and rash.  ?Neurological:  Negative for dizziness and headaches.  ? ?   ?Objective:  ?  ?Physical Exam ?Constitutional:   ?   Appearance: He is not ill-appearing.   ?HENT:  ?   Head: Normocephalic.  ?Eyes:  ?   Conjunctiva/sclera: Conjunctivae normal.  ?Neck:  ?   Comments: Mild edema and tenderness to L tonsillar lymph node.  ?No overlying erythema or significant lymphadenopathy.  ?Cardiovascular:  ?   Rate and Rhythm: Normal rate.  ?Pulmonary:  ?   Effort: Pulmonary effort is normal. No respiratory distress.  ?Skin: ?   Comments: Right eyelid with thickened, cracked skin. No edema, extending erythema.   ?Neurological:  ?   General: No focal deficit present.  ?   Mental Status: He is alert and oriented to person, place, and time.  ?Psychiatric:     ?   Mood and Affect: Mood normal.     ?   Behavior: Behavior normal.  ? ? ?There were no vitals taken for this visit. ?Wt Readings from Last 3 Encounters:  ?01/29/21 (!) 310 lb (140.6 kg)  ?07/22/20 270 lb (122.5 kg)  ?10/06/19 268 lb 12.8 oz (121.9 kg)  ? ? ?Health Maintenance Due  ?Topic Date Due  ? COVID-19 Vaccine (4 - Booster for Moderna series) 03/01/2020  ? ? ?There are no preventive care reminders to display for this patient. ? ? ?Lab Results  ?Component Value Date  ? TSH 0.979 09/29/2019  ? ?Lab Results  ?Component Value Date  ? WBC 10.6 (H) 07/22/2020  ? HGB 15.3 07/22/2020  ? HCT 44.4 07/22/2020  ? MCV 92.7 07/22/2020  ? PLT 206 07/22/2020  ? ?Lab Results  ?Component Value Date  ? NA 139 07/22/2020  ? K 4.9 07/22/2020  ? CO2 25 07/22/2020  ? GLUCOSE 117 (H) 07/22/2020  ? BUN 14 07/22/2020  ? CREATININE 0.86 07/22/2020  ? BILITOT 0.4 09/29/2019  ? ALKPHOS 83 09/29/2019  ? AST 17 09/29/2019  ? ALT 16 09/29/2019  ? PROT 6.5 09/29/2019  ? ALBUMIN 4.3 09/29/2019  ? CALCIUM 9.1 07/22/2020  ? ANIONGAP 8 07/22/2020  ? ?Lab Results  ?Component Value Date  ? CHOL 151 09/29/2019  ? ?Lab Results  ?Component Value Date  ? HDL 37 (L) 09/29/2019  ? ?Lab Results  ?Component Value Date  ? LDLCALC 100 (H) 09/29/2019  ? ?Lab Results  ?Component Value Date  ? TRIG 72 09/29/2019  ? ?Lab Results  ?Component Value Date  ? CHOLHDL 4.1  09/08/2017  ? ?Lab Results  ?Component Value Date  ? HGBA1C 5.3 09/29/2019  ? ? ?   ?Assessment & Plan:  ? ?Problem List Items Addressed This Visit   ? ?  ? Musculoskeletal and Integument  ? Eyelid eczema, right - Primary  ?  Rx both a topical steroid  and pimecrolimus cream. Advised caution with getting in eye, and to rinse eye repeatedly if this occurs. ? ?If no resolution in 2 weeks, ref to derm ?  ?  ? Relevant Medications  ? pimecrolimus (ELIDEL) 1 % cream  ? hydrocortisone valerate cream (WESTCORT) 0.2 %  ?  ? Immune and Lymphatic  ? Left cervical lymphadenopathy  ?  Very minimal. ?Recommended advil, warm compresses, light massage. ? ?  ?  ?  ?I, Benjamin Kirschner, PA-C have reviewed all documentation for this visit. The documentation on  04/10/2021  for the exam, diagnosis, procedures, and orders are all accurate and complete. ? ?Benjamin Kirschner, PA-C ?[facility] ?[id number] ?[place], [place], [address] ?Office: [phone] ?Fax: [phone]  ? ?

## 2021-04-10 NOTE — Telephone Encounter (Signed)
Pt called in about hydrocortisone valerate cream (WESTCORT) 0.2 % and pimecrolimus (ELIDEL) 1 % cream, says too expensive with insurance. He is looking for alternatives. Please call back ?

## 2021-04-10 NOTE — Assessment & Plan Note (Signed)
Very minimal. ?Recommended advil, warm compresses, light massage. ? ?

## 2021-04-10 NOTE — Progress Notes (Signed)
Other rx too expensive ?

## 2021-05-24 ENCOUNTER — Telehealth: Payer: BC Managed Care – PPO | Admitting: Physician Assistant

## 2021-05-24 DIAGNOSIS — J0110 Acute frontal sinusitis, unspecified: Secondary | ICD-10-CM

## 2021-05-24 MED ORDER — DOXYCYCLINE HYCLATE 100 MG PO TABS: 100.0000 mg | ORAL_TABLET | Freq: Two times a day (BID) | ORAL | 0 refills | Status: AC

## 2021-05-24 NOTE — Progress Notes (Signed)
E-Visit for Sinus Problems ? ?We are sorry that you are not feeling well.  Here is how we plan to help! ? ?Based on what you have shared with me it looks like you have sinusitis.  Sinusitis is inflammation and infection in the sinus cavities of the head.  Based on your presentation I believe you most likely have Acute Bacterial Sinusitis.  This is an infection caused by bacteria and is treated with antibiotics. I have prescribed Doxycycline 100mg by mouth twice a day for 10 days. You may use an oral decongestant such as Mucinex D or if you have glaucoma or high blood pressure use plain Mucinex. Saline nasal spray help and can safely be used as often as needed for congestion.  If you develop worsening sinus pain, fever or notice severe headache and vision changes, or if symptoms are not better after completion of antibiotic, please schedule an appointment with a health care provider.   ? ?Sinus infections are not as easily transmitted as other respiratory infection, however we still recommend that you avoid close contact with loved ones, especially the very young and elderly.  Remember to wash your hands thoroughly throughout the day as this is the number one way to prevent the spread of infection! ? ?Home Care: ?Only take medications as instructed by your medical team. ?Complete the entire course of an antibiotic. ?Do not take these medications with alcohol. ?A steam or ultrasonic humidifier can help congestion.  You can place a towel over your head and breathe in the steam from hot water coming from a faucet. ?Avoid close contacts especially the very young and the elderly. ?Cover your mouth when you cough or sneeze. ?Always remember to wash your hands. ? ?Get Help Right Away If: ?You develop worsening fever or sinus pain. ?You develop a severe head ache or visual changes. ?Your symptoms persist after you have completed your treatment plan. ? ?Make sure you ?Understand these instructions. ?Will watch your  condition. ?Will get help right away if you are not doing well or get worse. ? ?Thank you for choosing an e-visit. ? ?Your e-visit answers were reviewed by a board certified advanced clinical practitioner to complete your personal care plan. Depending upon the condition, your plan could have included both over the counter or prescription medications. ? ?Please review your pharmacy choice. Make sure the pharmacy is open so you can pick up prescription now. If there is a problem, you may contact your provider through MyChart messaging and have the prescription routed to another pharmacy.  Your safety is important to us. If you have drug allergies check your prescription carefully.  ? ?For the next 24 hours you can use MyChart to ask questions about today's visit, request a non-urgent call back, or ask for a work or school excuse. ?You will get an email in the next two days asking about your experience. I hope that your e-visit has been valuable and will speed your recovery. ? ?I have spent 5 minutes in review of e-visit questionnaire, review and updating patient chart, medical decision making and response to patient.  ? ?Jamisyn Langer S Mayers, PA-C ? ? ? ? ?

## 2021-07-30 DIAGNOSIS — R051 Acute cough: Secondary | ICD-10-CM | POA: Diagnosis not present

## 2021-07-30 DIAGNOSIS — B9689 Other specified bacterial agents as the cause of diseases classified elsewhere: Secondary | ICD-10-CM | POA: Diagnosis not present

## 2021-07-30 DIAGNOSIS — J019 Acute sinusitis, unspecified: Secondary | ICD-10-CM | POA: Diagnosis not present

## 2021-11-06 DIAGNOSIS — Z03818 Encounter for observation for suspected exposure to other biological agents ruled out: Secondary | ICD-10-CM | POA: Diagnosis not present

## 2021-11-06 DIAGNOSIS — R112 Nausea with vomiting, unspecified: Secondary | ICD-10-CM | POA: Diagnosis not present

## 2021-11-06 DIAGNOSIS — Z8669 Personal history of other diseases of the nervous system and sense organs: Secondary | ICD-10-CM | POA: Diagnosis not present

## 2021-11-06 DIAGNOSIS — R519 Headache, unspecified: Secondary | ICD-10-CM | POA: Diagnosis not present

## 2021-11-24 DIAGNOSIS — S99921A Unspecified injury of right foot, initial encounter: Secondary | ICD-10-CM | POA: Diagnosis not present

## 2021-11-24 DIAGNOSIS — S91201A Unspecified open wound of right great toe with damage to nail, initial encounter: Secondary | ICD-10-CM | POA: Diagnosis not present

## 2021-11-24 DIAGNOSIS — L089 Local infection of the skin and subcutaneous tissue, unspecified: Secondary | ICD-10-CM | POA: Diagnosis not present

## 2021-12-26 ENCOUNTER — Telehealth: Payer: BC Managed Care – PPO | Admitting: Physician Assistant

## 2021-12-26 DIAGNOSIS — B9689 Other specified bacterial agents as the cause of diseases classified elsewhere: Secondary | ICD-10-CM | POA: Diagnosis not present

## 2021-12-26 DIAGNOSIS — J019 Acute sinusitis, unspecified: Secondary | ICD-10-CM | POA: Diagnosis not present

## 2021-12-26 MED ORDER — DOXYCYCLINE HYCLATE 100 MG PO TABS: 100.0000 mg | ORAL_TABLET | Freq: Two times a day (BID) | ORAL | 0 refills | Status: DC

## 2021-12-26 NOTE — Progress Notes (Signed)
E-Visit for Sinus Problems  We are sorry that you are not feeling well.  Here is how we plan to help!  Based on what you have shared with me it looks like you have sinusitis.  Sinusitis is inflammation and infection in the sinus cavities of the head.  Based on your presentation I believe you most likely have Acute Bacterial Sinusitis.  This is an infection caused by bacteria and is treated with antibiotics. I have prescribed Doxycycline 100mg by mouth twice a day for 10 days. You may use an oral decongestant such as Mucinex D or if you have glaucoma or high blood pressure use plain Mucinex. Saline nasal spray help and can safely be used as often as needed for congestion.  If you develop worsening sinus pain, fever or notice severe headache and vision changes, or if symptoms are not better after completion of antibiotic, please schedule an appointment with a health care provider.    Sinus infections are not as easily transmitted as other respiratory infection, however we still recommend that you avoid close contact with loved ones, especially the very young and elderly.  Remember to wash your hands thoroughly throughout the day as this is the number one way to prevent the spread of infection!  Home Care: Only take medications as instructed by your medical team. Complete the entire course of an antibiotic. Do not take these medications with alcohol. A steam or ultrasonic humidifier can help congestion.  You can place a towel over your head and breathe in the steam from hot water coming from a faucet. Avoid close contacts especially the very young and the elderly. Cover your mouth when you cough or sneeze. Always remember to wash your hands.  Get Help Right Away If: You develop worsening fever or sinus pain. You develop a severe head ache or visual changes. Your symptoms persist after you have completed your treatment plan.  Make sure you Understand these instructions. Will watch your  condition. Will get help right away if you are not doing well or get worse.  Thank you for choosing an e-visit.  Your e-visit answers were reviewed by a board certified advanced clinical practitioner to complete your personal care plan. Depending upon the condition, your plan could have included both over the counter or prescription medications.  Please review your pharmacy choice. Make sure the pharmacy is open so you can pick up prescription now. If there is a problem, you may contact your provider through MyChart messaging and have the prescription routed to another pharmacy.  Your safety is important to us. If you have drug allergies check your prescription carefully.   For the next 24 hours you can use MyChart to ask questions about today's visit, request a non-urgent call back, or ask for a work or school excuse. You will get an email in the next two days asking about your experience. I hope that your e-visit has been valuable and will speed your recovery.  I have spent 5 minutes in review of e-visit questionnaire, review and updating patient chart, medical decision making and response to patient.   Damel Querry M Woodie Degraffenreid, PA-C  

## 2022-04-27 ENCOUNTER — Telehealth: Payer: BC Managed Care – PPO | Admitting: Physician Assistant

## 2022-04-27 DIAGNOSIS — B9689 Other specified bacterial agents as the cause of diseases classified elsewhere: Secondary | ICD-10-CM

## 2022-04-27 DIAGNOSIS — J019 Acute sinusitis, unspecified: Secondary | ICD-10-CM | POA: Diagnosis not present

## 2022-04-27 MED ORDER — DOXYCYCLINE HYCLATE 100 MG PO TABS: 100.0000 mg | ORAL_TABLET | Freq: Two times a day (BID) | ORAL | 0 refills | Status: DC

## 2022-04-27 NOTE — Progress Notes (Signed)
E-Visit for Sinus Problems  We are sorry that you are not feeling well.  Here is how we plan to help!  Based on what you have shared with me it looks like you have sinusitis.  Sinusitis is inflammation and infection in the sinus cavities of the head.  Based on your presentation I believe you most likely have Acute Bacterial Sinusitis.  This is an infection caused by bacteria and is treated with antibiotics. I have prescribed Doxycycline 100mg by mouth twice a day for 10 days. You may use an oral decongestant such as Mucinex D or if you have glaucoma or high blood pressure use plain Mucinex. Saline nasal spray help and can safely be used as often as needed for congestion.  If you develop worsening sinus pain, fever or notice severe headache and vision changes, or if symptoms are not better after completion of antibiotic, please schedule an appointment with a health care provider.    Sinus infections are not as easily transmitted as other respiratory infection, however we still recommend that you avoid close contact with loved ones, especially the very young and elderly.  Remember to wash your hands thoroughly throughout the day as this is the number one way to prevent the spread of infection!  Home Care: Only take medications as instructed by your medical team. Complete the entire course of an antibiotic. Do not take these medications with alcohol. A steam or ultrasonic humidifier can help congestion.  You can place a towel over your head and breathe in the steam from hot water coming from a faucet. Avoid close contacts especially the very young and the elderly. Cover your mouth when you cough or sneeze. Always remember to wash your hands.  Get Help Right Away If: You develop worsening fever or sinus pain. You develop a severe head ache or visual changes. Your symptoms persist after you have completed your treatment plan.  Make sure you Understand these instructions. Will watch your  condition. Will get help right away if you are not doing well or get worse.  Thank you for choosing an e-visit.  Your e-visit answers were reviewed by a board certified advanced clinical practitioner to complete your personal care plan. Depending upon the condition, your plan could have included both over the counter or prescription medications.  Please review your pharmacy choice. Make sure the pharmacy is open so you can pick up prescription now. If there is a problem, you may contact your provider through MyChart messaging and have the prescription routed to another pharmacy.  Your safety is important to us. If you have drug allergies check your prescription carefully.   For the next 24 hours you can use MyChart to ask questions about today's visit, request a non-urgent call back, or ask for a work or school excuse. You will get an email in the next two days asking about your experience. I hope that your e-visit has been valuable and will speed your recovery.  I have spent 5 minutes in review of e-visit questionnaire, review and updating patient chart, medical decision making and response to patient.   Jeremi Losito M Lynea Rollison, PA-C  

## 2022-08-31 ENCOUNTER — Telehealth: Payer: BC Managed Care – PPO | Admitting: Nurse Practitioner

## 2022-08-31 DIAGNOSIS — H66003 Acute suppurative otitis media without spontaneous rupture of ear drum, bilateral: Secondary | ICD-10-CM

## 2022-08-31 MED ORDER — AMOXICILLIN 875 MG PO TABS: 875.0000 mg | ORAL_TABLET | Freq: Two times a day (BID) | ORAL | 0 refills | Status: AC

## 2022-08-31 MED ORDER — IPRATROPIUM BROMIDE 0.03 % NA SOLN: 2.0000 | Freq: Two times a day (BID) | NASAL | 12 refills | Status: DC

## 2022-08-31 NOTE — Progress Notes (Signed)
E-Visit for Ear Pain - Acute Otitis Media   We are sorry that you are not feeling well. Here is how we plan to help!  Based on what you have shared with me it looks like you have Acute Otitis Media.  Acute Otitis Media is an infection of the middle or "inner" ear. This type of infection can cause redness, inflammation, and fluid buildup behind the tympanic membrane (ear drum).  The usual symptoms include: Earache/Pain Fever Upper respiratory symptoms Lack of energy/Fatigue/Malaise Slight hearing loss gradually worsening- if the inner ear fills with fluid What causes middle ear infections? Most middle ear infections occur when an infection such as a cold, leads to a build-up of mucus in the middle ear and causes the Eustachian tube (a thin tube that runs from the middle ear to the back of the nose) to become swollen or blocked.   This means mucus can't drain away properly, making it easier for an infection to spread into the middle ear.  How middle ear infections are treated: Most ear infections clear up within three to five days and don't need any specific treatment. If necessary, tylenol or ibuprofen should be used to relieve pain and a high temperature.  If you develop a fever higher than 102, or any significantly worsening symptoms, this could indicate a more serious infection moving to the middle/inner and needs face to face evaluation in an office by a provider.   Antibiotics aren't routinely used to treat middle ear infections, although they may occasionally be prescribed if symptoms persist or are particularly severe. Given your presentation,   I have prescribed Amoxicillin 875 mg one tablet twice daily for 10 days  I have also prescribed a nasal spray to help with the sinus pressure as well  Meds ordered this encounter  Medications   amoxicillin (AMOXIL) 875 MG tablet    Sig: Take 1 tablet (875 mg total) by mouth 2 (two) times daily for 10 days.    Dispense:  20 tablet     Refill:  0   ipratropium (ATROVENT) 0.03 % nasal spray    Sig: Place 2 sprays into both nostrils every 12 (twelve) hours.    Dispense:  30 mL    Refill:  12      Your symptoms should improve over the next 3 days and should resolve in about 7 days. Be sure to complete ALL of the prescription(s) given.  HOME CARE: Wash your hands frequently. If you are prescribed an ear drop, do not place the tip of the bottle on your ear or touch it with your fingers. You can take Acetaminophen 650 mg every 4-6 hours as needed for pain.  If pain is severe or moderate, you can apply a heating pad (set on low) or hot water bottle (wrapped in a towel) to outer ear for 20 minutes.  This will also increase drainage.  GET HELP RIGHT AWAY IF: Fever is over 102.2 degrees. You develop progressive ear pain or hearing loss. Ear symptoms persist longer than 3 days after treatment.  MAKE SURE YOU: Understand these instructions. Will watch your condition. Will get help right away if you are not doing well or get worse.  Thank you for choosing an e-visit.  Your e-visit answers were reviewed by a board certified advanced clinical practitioner to complete your personal care plan. Depending upon the condition, your plan could have included both over the counter or prescription medications.  Please review your pharmacy choice. Make sure the  pharmacy is open so you can pick up the prescription now. If there is a problem, you may contact your provider through [employer] and have the prescription routed to another pharmacy.  Your safety is important to [place]. If you have drug allergies check your prescription carefully.   For the next 24 hours you can use MyChart to ask questions about today's visit, request a non-urgent call back, or ask for a work or school excuse. You will get an email with a survey after your eVisit asking about your experience. We would appreciate your feedback. I hope that your e-visit has been  valuable and will aid in your recovery.  I spent approximately 5 minutes reviewing the patient's history, current symptoms and coordinating their care today.

## 2022-10-27 ENCOUNTER — Other Ambulatory Visit: Payer: Self-pay

## 2022-10-27 ENCOUNTER — Emergency Department
Admission: EM | Admit: 2022-10-27 | Discharge: 2022-10-27 | Disposition: A | Discharge status: Home / Self Care | Payer: PRIVATE HEALTH INSURANCE | Attending: Emergency Medicine | Admitting: Emergency Medicine

## 2022-10-27 ENCOUNTER — Encounter: Payer: Self-pay | Admitting: Emergency Medicine

## 2022-10-27 DIAGNOSIS — K529 Noninfective gastroenteritis and colitis, unspecified: Secondary | ICD-10-CM | POA: Insufficient documentation

## 2022-10-27 DIAGNOSIS — R1012 Left upper quadrant pain: Secondary | ICD-10-CM | POA: Diagnosis present

## 2022-10-27 LAB — URINALYSIS, ROUTINE W REFLEX MICROSCOPIC
Bilirubin Urine: NEGATIVE
Glucose, UA: NEGATIVE mg/dL
Hgb urine dipstick: NEGATIVE
Ketones, ur: NEGATIVE mg/dL
Leukocytes,Ua: NEGATIVE
Nitrite: NEGATIVE
Protein, ur: NEGATIVE mg/dL
RBC / HPF: 0 RBC/hpf (ref 0–5)
Specific Gravity, Urine: 1.014 (ref 1.005–1.030)
Squamous Epithelial / HPF: 0 /HPF (ref 0–5)
pH: 5 (ref 5.0–8.0)

## 2022-10-27 LAB — CBC
HCT: 44.1 % (ref 39.0–52.0)
Hemoglobin: 14.5 g/dL (ref 13.0–17.0)
MCH: 30.6 pg (ref 26.0–34.0)
MCHC: 32.9 g/dL (ref 30.0–36.0)
MCV: 93 fL (ref 80.0–100.0)
Platelets: 245 10*3/uL (ref 150–400)
RBC: 4.74 MIL/uL (ref 4.22–5.81)
RDW: 13.8 % (ref 11.5–15.5)
WBC: 7.2 10*3/uL (ref 4.0–10.5)
nRBC: 0 % (ref 0.0–0.2)

## 2022-10-27 LAB — COMPREHENSIVE METABOLIC PANEL
ALT: 18 U/L (ref 0–44)
AST: 16 U/L (ref 15–41)
Albumin: 4.1 g/dL (ref 3.5–5.0)
Alkaline Phosphatase: 81 U/L (ref 38–126)
Anion gap: 8 (ref 5–15)
BUN: 15 mg/dL (ref 6–20)
CO2: 25 mmol/L (ref 22–32)
Calcium: 8.9 mg/dL (ref 8.9–10.3)
Chloride: 106 mmol/L (ref 98–111)
Creatinine, Ser: 0.73 mg/dL (ref 0.61–1.24)
GFR, Estimated: >60 mL/min (ref >60)
Glucose, Bld: 106 mg/dL — ABNORMAL HIGH (ref 70–99)
Potassium: 4.3 mmol/L (ref 3.5–5.1)
Sodium: 139 mmol/L (ref 135–145)
Total Bilirubin: 0.6 mg/dL (ref 0.3–1.2)
Total Protein: 7.7 g/dL (ref 6.5–8.1)

## 2022-10-27 LAB — LIPASE, BLOOD: Lipase: 34 U/L (ref 11–51)

## 2022-10-27 MED ORDER — ONDANSETRON HCL 4 MG/2ML IJ SOLN
4.0000 mg | Freq: Once | INTRAMUSCULAR | Status: AC
  Administered 2022-10-27: 4 mg via INTRAVENOUS
  Filled 2022-10-27: qty 2

## 2022-10-27 MED ORDER — ALUM & MAG HYDROXIDE-SIMETH 200-200-20 MG/5ML PO SUSP
30.0000 mL | Freq: Once | ORAL | Status: AC
  Administered 2022-10-27: 30 mL via ORAL
  Filled 2022-10-27: qty 30

## 2022-10-27 MED ORDER — LACTATED RINGERS IV BOLUS
1000.0000 mL | Freq: Once | INTRAVENOUS | Status: AC
  Administered 2022-10-27: 1000 mL via INTRAVENOUS

## 2022-10-27 MED ORDER — ONDANSETRON 4 MG PO TBDP: 4.0000 mg | ORAL_TABLET | Freq: Three times a day (TID) | ORAL | 0 refills | Status: DC | PRN

## 2022-10-27 MED ORDER — PANTOPRAZOLE SODIUM 40 MG PO TBEC: 40.0000 mg | DELAYED_RELEASE_TABLET | Freq: Every day | ORAL | 0 refills | Status: DC

## 2022-10-27 MED ORDER — LIDOCAINE VISCOUS HCL 2 % MT SOLN
15.0000 mL | Freq: Once | OROMUCOSAL | Status: AC
  Administered 2022-10-27: 15 mL via ORAL
  Filled 2022-10-27: qty 15

## 2022-10-27 NOTE — ED Notes (Signed)
Pt was given a PO test challenge per EDP to see if he can hold fluids down. Pt given 8 oz and was able to drink it without feeling nauseated.

## 2022-10-27 NOTE — ED Provider Notes (Signed)
[facility] Provider Note    Event Date/Time   First MD Initiated Contact with Patient 10/27/22 1025     (approximate)   History   Chief Complaint Abdominal Pain   HPI  Benjamin Adams is a 42 y.o. male with past medical history of migraines and asthma who presents to the ED complaining of abdominal pain.  Patient reports that he has been dealing with left upper quadrant abdominal pain since eating [place] food yesterday.  He states that he has been feeling nauseous since last night, has vomited whenever he tries to eat anything.  He reports an episode of diarrhea yesterday as well, and has not noticed any blood in his stool.  He denies any fevers, dysuria, or flank pain.  He denies any history of similar symptoms, has never had surgery on his abdomen.     Physical Exam   Triage Vital Signs: ED Triage Vitals [10/27/22 0901]  Encounter Vitals Group     BP (!) 143/106     Systolic BP Percentile      Diastolic BP Percentile      Pulse Rate 91     Resp (!) 22     Temp 98.1 F (36.7 C)     Temp Source Oral     SpO2 97 %     Weight (!) 314 lb 9.5 oz (142.7 kg)     Height 5\' 2"  (1.575 m)     Head Circumference      Peak Flow      Pain Score 7     Pain Loc      Pain Education      Exclude from Growth Chart     Most recent vital signs: Vitals:   10/27/22 1321 10/27/22 1321  BP: (!) 141/79 (!) 141/79  Pulse:  76  Resp:  20  Temp:  97.8 F (36.6 C)  SpO2:  98%    Constitutional: Alert and oriented. Eyes: Conjunctivae are normal. Head: Atraumatic. Nose: No congestion/rhinnorhea. Mouth/Throat: Mucous membranes are moist.  Cardiovascular: Normal rate, regular rhythm. Grossly normal heart sounds.  2+ radial pulses bilaterally. Respiratory: Normal respiratory effort.  No retractions. Lungs CTAB. Gastrointestinal: Soft and nontender. No distention. Musculoskeletal: No lower extremity tenderness nor edema.  Neurologic:  Normal speech and  language. No gross focal neurologic deficits are appreciated.    ED Results / Procedures / Treatments   Labs (all labs ordered are listed, but only abnormal results are displayed) Labs Reviewed  COMPREHENSIVE METABOLIC PANEL - Abnormal; Notable for the following components:      Result Value   Glucose, Bld 106 (*)    All other components within normal limits  URINALYSIS, ROUTINE W REFLEX MICROSCOPIC - Abnormal; Notable for the following components:   Color, Urine YELLOW (*)    APPearance CLEAR (*)    Bacteria, UA RARE (*)    All other components within normal limits  LIPASE, BLOOD  CBC    PROCEDURES:  Critical Care performed: No  Procedures   MEDICATIONS ORDERED IN ED: Medications  lactated ringers bolus 1,000 mL (0 mLs Intravenous Stopped 10/27/22 1246)  ondansetron (ZOFRAN) injection 4 mg (4 mg Intravenous Given 10/27/22 1120)  alum & mag hydroxide-simeth (MAALOX/MYLANTA) 200-200-20 MG/5ML suspension 30 mL (30 mLs Oral Given 10/27/22 1121)    And  lidocaine (XYLOCAINE) 2 % viscous mouth solution 15 mL (15 mLs Oral Given 10/27/22 1121)     IMPRESSION / MDM / ASSESSMENT AND PLAN / ED  COURSE  I reviewed the triage vital signs and the nursing notes.                              42 y.o. male with no significant past medical history who presents to the ED complaining of left upper quadrant abdominal pain with vomiting and diarrhea since yesterday.  Patient's presentation is most consistent with acute presentation with potential threat to life or bodily function.  Differential diagnosis includes, but is not limited to, gastroenteritis, gastritis, pancreatitis, hepatitis, biliary colic, cholecystitis, dehydration, electrolyte abnormality, AKI.  Patient nontoxic-appearing and in no acute distress, vital signs are unremarkable.  His abdomen is benign with no focal tenderness, symptoms seem most consistent with gastroenteritis, may have a component of gastritis as well.  Labs  without significant anemia, leukocytosis, tract abnormality, or AKI.  LFTs and lipase are also unremarkable, urinalysis without signs of infection or bleeding to suggest kidney stone.  We will treat symptomatically with IV Zofran, hydrate with IV fluids, and give dose of GI cocktail.  Patient feeling better following symptomatic management, now tolerating oral intake without difficulty.  He is appropriate for outpatient management, will prescribe Zofran for use as needed along with PPI.  He was counseled to return to the ED for new or worsening symptoms, patient agrees with plan.      FINAL CLINICAL IMPRESSION(S) / ED DIAGNOSES   Final diagnoses:  Gastroenteritis     Rx / DC Orders   ED Discharge Orders          Ordered    ondansetron (ZOFRAN-ODT) 4 MG disintegrating tablet  Every 8 hours PRN        10/27/22 1323    pantoprazole (PROTONIX) 40 MG tablet  Daily        10/27/22 1323             Note:  This document was prepared using Dragon voice recognition software and may include unintentional dictation errors.   Chesley Noon, MD 10/27/22 1324

## 2022-10-27 NOTE — ED Notes (Signed)
Pt states pain slightly better, still has "twisting" feeling "like a wrench" to mid and L periumbilical area.

## 2022-10-27 NOTE — ED Notes (Signed)
Pt in with co left upper quad pain states started yesterday after eating. States vomited x 1 with 1 episode of diarrhea.

## 2022-10-27 NOTE — ED Triage Notes (Signed)
Pt here with abd pain since yesterday. Pt states he ate [place] and thought it was food poisoning. Pt states he threw up chunks of whole food. Pt states the pain is on his left flank and radiates to his abd. Pt endorses N and V. Pt denies fever.

## 2023-06-14 ENCOUNTER — Telehealth: Payer: PRIVATE HEALTH INSURANCE | Admitting: Nurse Practitioner

## 2023-06-14 DIAGNOSIS — J019 Acute sinusitis, unspecified: Secondary | ICD-10-CM

## 2023-06-14 MED ORDER — DOXYCYCLINE HYCLATE 100 MG PO TABS: 100.0000 mg | ORAL_TABLET | Freq: Two times a day (BID) | ORAL | 0 refills | Status: AC

## 2023-06-14 NOTE — Progress Notes (Signed)

## 2023-06-28 ENCOUNTER — Encounter: Payer: Self-pay | Admitting: Intensive Care

## 2023-06-28 ENCOUNTER — Emergency Department
Admission: EM | Admit: 2023-06-28 | Discharge: 2023-06-28 | Disposition: A | Discharge status: Home / Self Care | Payer: PRIVATE HEALTH INSURANCE | Attending: Emergency Medicine | Admitting: Emergency Medicine

## 2023-06-28 ENCOUNTER — Other Ambulatory Visit: Payer: Self-pay

## 2023-06-28 DIAGNOSIS — H9203 Otalgia, bilateral: Secondary | ICD-10-CM | POA: Diagnosis present

## 2023-06-28 DIAGNOSIS — J0140 Acute pansinusitis, unspecified: Secondary | ICD-10-CM | POA: Diagnosis not present

## 2023-06-28 DIAGNOSIS — H6993 Unspecified Eustachian tube disorder, bilateral: Secondary | ICD-10-CM | POA: Diagnosis not present

## 2023-06-28 LAB — CBC WITH DIFFERENTIAL/PLATELET
Abs Immature Granulocytes: 0.04 10*3/uL (ref 0.00–0.07)
Basophils Absolute: 0 10*3/uL (ref 0.0–0.1)
Basophils Relative: 0 %
Eosinophils Absolute: 0.2 10*3/uL (ref 0.0–0.5)
Eosinophils Relative: 2 %
HCT: 40.7 % (ref 39.0–52.0)
Hemoglobin: 13.2 g/dL (ref 13.0–17.0)
Immature Granulocytes: 0 %
Lymphocytes Relative: 15 %
Lymphs Abs: 1.4 10*3/uL (ref 0.7–4.0)
MCH: 31.1 pg (ref 26.0–34.0)
MCHC: 32.4 g/dL (ref 30.0–36.0)
MCV: 95.8 fL (ref 80.0–100.0)
Monocytes Absolute: 0.6 10*3/uL (ref 0.1–1.0)
Monocytes Relative: 6 %
Neutro Abs: 6.8 10*3/uL (ref 1.7–7.7)
Neutrophils Relative %: 77 %
Platelets: 203 10*3/uL (ref 150–400)
RBC: 4.25 MIL/uL (ref 4.22–5.81)
RDW: 13.9 % (ref 11.5–15.5)
WBC: 9 10*3/uL (ref 4.0–10.5)
nRBC: 0 % (ref 0.0–0.2)

## 2023-06-28 LAB — COMPREHENSIVE METABOLIC PANEL WITH GFR
ALT: 19 U/L (ref 0–44)
AST: 19 U/L (ref 15–41)
Albumin: 3.8 g/dL (ref 3.5–5.0)
Alkaline Phosphatase: 70 U/L (ref 38–126)
Anion gap: 11 (ref 5–15)
BUN: 16 mg/dL (ref 6–20)
CO2: 25 mmol/L (ref 22–32)
Calcium: 8.6 mg/dL — ABNORMAL LOW (ref 8.9–10.3)
Chloride: 104 mmol/L (ref 98–111)
Creatinine, Ser: 0.66 mg/dL (ref 0.61–1.24)
GFR, Estimated: >60 mL/min (ref >60)
Glucose, Bld: 100 mg/dL — ABNORMAL HIGH (ref 70–99)
Potassium: 4.3 mmol/L (ref 3.5–5.1)
Sodium: 140 mmol/L (ref 135–145)
Total Bilirubin: 0.6 mg/dL (ref 0.0–1.2)
Total Protein: 7.1 g/dL (ref 6.5–8.1)

## 2023-06-28 MED ORDER — FLUTICASONE PROPIONATE 50 MCG/ACT NA SUSP: 2.0000 | Freq: Every day | NASAL | 2 refills | Status: AC

## 2023-06-28 MED ORDER — PREDNISONE 10 MG PO TABS: ORAL_TABLET | ORAL | 0 refills | Status: DC

## 2023-06-28 MED ORDER — AMOXICILLIN 875 MG PO TABS: 875.0000 mg | ORAL_TABLET | Freq: Two times a day (BID) | ORAL | 0 refills | Status: DC

## 2023-06-28 NOTE — Discharge Instructions (Addendum)
 Call make an appointment with Dr. Jolly Needle who is on-call for [facility] if any continued problems or not improving in 7 to 10 days.  Prescription was sent to the pharmacy for you to begin taking.  The amoxicillin  875 twice daily, Flonase  2 sprays each nostril 1 time a day and prednisone  starting with 6 tablets today and tapering down by 1 tablet each day.  Increase fluids to stay hydrated.

## 2023-06-28 NOTE — ED Provider Notes (Signed)
 [facility] Provider Note    Event Date/Time   First MD Initiated Contact with Patient 06/28/23 1232     (approximate)   History   Ear Pain and Dizziness   HPI  Benjamin Adams is a 43 y.o. male   presents to the ED with complaints of sinus infection ongoing along with bilateral ear pain and pressure.  Patient states that he was treated with doxycycline  prior to going on a cruise but never felt like he got better.  He also has air sickness and flu home after the cruise yesterday which has increased his pain especially for his ears.  Patient states that this also has caused some dizziness and limited episode of emesis due to the dizziness.  Patient denies any fever or chills.  There is facial pain present.  Patient has a history of asthma and hypoglycemia.      Physical Exam   Triage Vital Signs: ED Triage Vitals  Encounter Vitals Group     BP 06/28/23 1205 (!) 125/92     Systolic BP Percentile --      Diastolic BP Percentile --      Pulse Rate 06/28/23 1205 86     Resp 06/28/23 1205 16     Temp 06/28/23 1205 98.4 F (36.9 C)     Temp Source 06/28/23 1205 Oral     SpO2 06/28/23 1205 99 %     Weight 06/28/23 1206 (!) 305 lb (138.3 kg)     Height 06/28/23 1206 5\' 9"  (1.753 m)     Head Circumference --      Peak Flow --      Pain Score 06/28/23 1205 10     Pain Loc --      Pain Education --      Exclude from Growth Chart --     Most recent vital signs: Vitals:   06/28/23 1205  BP: (!) 125/92  Pulse: 86  Resp: 16  Temp: 98.4 F (36.9 C)  SpO2: 99%     General: Awake, no distress.  Alert, talkative, nontoxic in appearance. CV:  Good peripheral perfusion.  Resp:  Normal effort.  Lungs are clear bilaterally. Abd:  No distention.  Other:  Tenderness on percussion to the frontal and maxillary sinuses bilaterally.  EACs are clear.  TMs are dull with poor light reflex but no erythema or injection is noted.  Neck is supple without cervical  lymphadenopathy.  Posterior pharynx clear.   ED Results / Procedures / Treatments   Labs (all labs ordered are listed, but only abnormal results are displayed) Labs Reviewed  COMPREHENSIVE METABOLIC PANEL WITH GFR - Abnormal; Notable for the following components:      Result Value   Glucose, Bld 100 (*)    Calcium 8.6 (*)    All other components within normal limits  CBC WITH DIFFERENTIAL/PLATELET      PROCEDURES:  Critical Care performed:   Procedures   MEDICATIONS ORDERED IN ED: Medications - No data to display   IMPRESSION / MDM / ASSESSMENT AND PLAN / ED COURSE  I reviewed the triage vital signs and the nursing notes.   Differential diagnosis includes, but is not limited to, eustachian tube dysfunction, sinusitis, viral illness, URI, seasonal allergies, dizziness secondary to eustachian tube dysfunction, otitis media, serous otitis, otitis externa.  43 year old male presents to the ED with symptoms of sinusitis and eustachian tube dysfunction.  Patient was treated with doxycycline  and went on a cruise.  He states that he flew from [place]  on his way back from his cruise.  Findings are consistent with a pansinusitis and eustachian tube dysfunction.  Lab work is reassuring.  There are multiple antibiotics that patient is unable to take but is a clavulanic acid in the Augmentin that he cannot take and has taken amoxicillin  without any difficulty.  I had a prescription for amoxicillin  875 twice daily for 10 days was sent to the pharmacy along with prescription for tapering dose of prednisone  and Flonase  nasal spray which he has used in the past without any difficulty.  Increase fluids.  Patient is encouraged to follow-up with Dr. Jolly Needle who is on-call for [facility] if any continued problems or not improving in the next 7 to 10 days.       Patient's presentation is most consistent with acute, uncomplicated illness.  FINAL CLINICAL IMPRESSION(S) / ED DIAGNOSES   Final  diagnoses:  Acute pansinusitis, recurrence not specified  Eustachian tube dysfunction, bilateral     Rx / DC Orders   ED Discharge Orders          Ordered    fluticasone  (FLONASE ) 50 MCG/ACT nasal spray  Daily        06/28/23 1251    amoxicillin  (AMOXIL ) 875 MG tablet  2 times daily        06/28/23 1251    predniSONE  (DELTASONE ) 10 MG tablet        06/28/23 1251             Note:  This document was prepared using Dragon voice recognition software and may include unintentional dictation errors.   Stafford Eagles, PA-C 06/28/23 1304    Bryson Carbine, MD 06/28/23 [phone]

## 2023-06-28 NOTE — ED Triage Notes (Signed)
 Patient reports he has a sinus infection before going on cruise. When he got on plane and flew home yesterday he has been having bilateral ear pain/pressure and dizziness since getting off plane. Also reports an episode of emesis

## 2023-09-29 ENCOUNTER — Other Ambulatory Visit: Payer: Self-pay

## 2023-09-29 ENCOUNTER — Emergency Department: Payer: PRIVATE HEALTH INSURANCE

## 2023-09-29 ENCOUNTER — Emergency Department
Admission: EM | Admit: 2023-09-29 | Discharge: 2023-09-29 | Disposition: A | Discharge status: Home / Self Care | Payer: PRIVATE HEALTH INSURANCE | Attending: Emergency Medicine | Admitting: Emergency Medicine

## 2023-09-29 DIAGNOSIS — E860 Dehydration: Secondary | ICD-10-CM | POA: Diagnosis not present

## 2023-09-29 DIAGNOSIS — U071 COVID-19: Secondary | ICD-10-CM | POA: Diagnosis not present

## 2023-09-29 DIAGNOSIS — R531 Weakness: Secondary | ICD-10-CM

## 2023-09-29 LAB — CBC WITH DIFFERENTIAL/PLATELET
Abs Immature Granulocytes: 0.04 K/uL (ref 0.00–0.07)
Basophils Absolute: 0 K/uL (ref 0.0–0.1)
Basophils Relative: 0 %
Eosinophils Absolute: 0.2 K/uL (ref 0.0–0.5)
Eosinophils Relative: 3 %
HCT: 45.3 % (ref 39.0–52.0)
Hemoglobin: 14.8 g/dL (ref 13.0–17.0)
Immature Granulocytes: 1 %
Lymphocytes Relative: 4 %
Lymphs Abs: 0.3 K/uL — ABNORMAL LOW (ref 0.7–4.0)
MCH: 30.5 pg (ref 26.0–34.0)
MCHC: 32.7 g/dL (ref 30.0–36.0)
MCV: 93.2 fL (ref 80.0–100.0)
Monocytes Absolute: 0.5 K/uL (ref 0.1–1.0)
Monocytes Relative: 8 %
Neutro Abs: 5.8 K/uL (ref 1.7–7.7)
Neutrophils Relative %: 84 %
Platelets: 212 K/uL (ref 150–400)
RBC: 4.86 MIL/uL (ref 4.22–5.81)
RDW: 13.9 % (ref 11.5–15.5)
WBC: 6.8 K/uL (ref 4.0–10.5)
nRBC: 0 % (ref 0.0–0.2)

## 2023-09-29 LAB — COMPREHENSIVE METABOLIC PANEL WITH GFR
ALT: 16 U/L (ref 0–44)
AST: 20 U/L (ref 15–41)
Albumin: 3.9 g/dL (ref 3.5–5.0)
Alkaline Phosphatase: 67 U/L (ref 38–126)
Anion gap: 4 — ABNORMAL LOW (ref 5–15)
BUN: 10 mg/dL (ref 6–20)
CO2: 30 mmol/L (ref 22–32)
Calcium: 8.8 mg/dL — ABNORMAL LOW (ref 8.9–10.3)
Chloride: 105 mmol/L (ref 98–111)
Creatinine, Ser: 0.89 mg/dL (ref 0.61–1.24)
GFR, Estimated: >60 mL/min (ref >60)
Glucose, Bld: 102 mg/dL — ABNORMAL HIGH (ref 70–99)
Potassium: 4.5 mmol/L (ref 3.5–5.1)
Sodium: 139 mmol/L (ref 135–145)
Total Bilirubin: 0.4 mg/dL (ref 0.0–1.2)
Total Protein: 7.2 g/dL (ref 6.5–8.1)

## 2023-09-29 LAB — TROPONIN I (HIGH SENSITIVITY): Troponin I (High Sensitivity): 3 ng/L (ref <18)

## 2023-09-29 LAB — RESP PANEL BY RT-PCR (RSV, FLU A&B, COVID)  RVPGX2
Influenza A by PCR: NEGATIVE
Influenza B by PCR: NEGATIVE
Resp Syncytial Virus by PCR: NEGATIVE
SARS Coronavirus 2 by RT PCR: POSITIVE — AB

## 2023-09-29 MED ORDER — BENZONATATE 100 MG PO CAPS
100.0000 mg | ORAL_CAPSULE | Freq: Three times a day (TID) | ORAL | 0 refills | Status: DC | PRN
Start: 2023-09-29 — End: 2023-11-05

## 2023-09-29 MED ORDER — SODIUM CHLORIDE 0.9 % IV BOLUS
1000.0000 mL | Freq: Once | INTRAVENOUS | Status: AC
  Administered 2023-09-29: 1000 mL via INTRAVENOUS

## 2023-09-29 MED ORDER — BENZONATATE 100 MG PO CAPS
100.0000 mg | ORAL_CAPSULE | Freq: Once | ORAL | Status: AC
  Administered 2023-09-29: 100 mg via ORAL
  Filled 2023-09-29: qty 1

## 2023-09-29 MED ORDER — ONDANSETRON HCL 4 MG/2ML IJ SOLN
4.0000 mg | Freq: Once | INTRAMUSCULAR | Status: AC
  Administered 2023-09-29: 4 mg via INTRAVENOUS
  Filled 2023-09-29: qty 2

## 2023-09-29 MED ORDER — ONDANSETRON 4 MG PO TBDP: 4.0000 mg | ORAL_TABLET | Freq: Three times a day (TID) | ORAL | 0 refills | Status: AC | PRN

## 2023-09-29 MED ORDER — ACETAMINOPHEN 500 MG PO TABS
1000.0000 mg | ORAL_TABLET | Freq: Once | ORAL | Status: AC
  Administered 2023-09-29: 1000 mg via ORAL
  Filled 2023-09-29: qty 2

## 2023-09-29 MED ORDER — KETOROLAC TROMETHAMINE 15 MG/ML IJ SOLN
15.0000 mg | Freq: Once | INTRAMUSCULAR | Status: AC
  Administered 2023-09-29: 15 mg via INTRAVENOUS
  Filled 2023-09-29: qty 1

## 2023-09-29 NOTE — Discharge Instructions (Addendum)
 You can take ibuprofen  600 every 8 hours with food alternating  with Tylenol  1 g every 8 hours.  Take Tessalon  Perles to help with any cough, Zofran  to help with any nausea.  Return to the ER if you develop increasing shortness of breath, swelling in 1 leg, pain with taking a deep breath for workup for pulmonary embolism.  Hold your testosterone on Tuesday to help prevent any blood clots.

## 2023-09-29 NOTE — ED Provider Notes (Signed)
 [facility] Provider Note    Event Date/Time   First MD Initiated Contact with Patient 09/29/23 1124     (approximate)   History   Weakness   HPI  Benjamin Adams is a 43 y.o. male who is otherwise healthy who comes in with concerns for flulike symptoms.  Patient reports he was treated for sinusitis.  He was on amoxicillin .  He went to his primary care doctor on Monday and has completed a course of amoxicillin .  He states that on Monday he will have a little bit of pain in his chest from coughing but then yesterday night into today he had sudden onset of fevers, body aches, not feeling his normal self, weakness.  He does report that the cough started worsening 2 days ago as well.  He denies any falls.  He denies any swelling in his legs.  Denies any urinary symptoms.  He does report having 3 prior COVID vaccines.  Has had COVID previously and done okay never requiring antivirals. He does report decreased p.o. intake and feeling dehydrated.  Physical Exam   Triage Vital Signs: ED Triage Vitals  Encounter Vitals Group     BP 09/29/23 1015 (!) 144/80     Girls Systolic BP Percentile --      Girls Diastolic BP Percentile --      Boys Systolic BP Percentile --      Boys Diastolic BP Percentile --      Pulse Rate 09/29/23 1015 (!) 128     Resp 09/29/23 1015 18     Temp 09/29/23 1015 (!) 100.6 F (38.1 C)     Temp Source 09/29/23 1015 Oral     SpO2 09/29/23 1015 99 %     Weight --      Height --      Head Circumference --      Peak Flow --      Pain Score 09/29/23 1016 0     Pain Loc --      Pain Education --      Exclude from Growth Chart --     Most recent vital signs: Vitals:   09/29/23 1015  BP: (!) 144/80  Pulse: (!) 128  Resp: 18  Temp: (!) 100.6 F (38.1 C)  SpO2: 99%     General: Awake, no distress.  CV:  Good peripheral perfusion.  Resp:  Normal effort.  Clear lungs Abd:  No distention.  Soft nontender Other:  No swelling legs.  No  calf tenderness   ED Results / Procedures / Treatments   Labs (all labs ordered are listed, but only abnormal results are displayed) Labs Reviewed  RESP PANEL BY RT-PCR (RSV, FLU A&B, COVID)  RVPGX2 - Abnormal; Notable for the following components:      Result Value   SARS Coronavirus 2 by RT PCR POSITIVE (*)    All other components within normal limits  CBC WITH DIFFERENTIAL/PLATELET - Abnormal; Notable for the following components:   Lymphs Abs 0.3 (*)    All other components within normal limits  COMPREHENSIVE METABOLIC PANEL WITH GFR - Abnormal; Notable for the following components:   Glucose, Bld 102 (*)    Calcium 8.8 (*)    Anion gap 4 (*)    All other components within normal limits     EKG  My interpretation of EKG:  Sinus tachycardia rate of 128 without any ST elevation, T wave version lead III, normal intervals  RADIOLOGY  I have reviewed the xray personally and interpreted no evidence of any pneumonia   PROCEDURES:  Critical Care performed: No  Procedures   MEDICATIONS ORDERED IN ED: Medications  sodium chloride  0.9 % bolus 1,000 mL (has no administration in time range)  ketorolac  (TORADOL ) 15 MG/ML injection 15 mg (has no administration in time range)  acetaminophen  (TYLENOL ) tablet 1,000 mg (has no administration in time range)  benzonatate  (TESSALON ) capsule 100 mg (has no administration in time range)     IMPRESSION / MDM / ASSESSMENT AND PLAN / ED COURSE  I reviewed the triage vital signs and the nursing notes.   Patient's presentation is most consistent with acute presentation with potential threat to life or bodily function.   Patient comes in with fevers, tachycardia.  Symptoms that just started last night into today after dealing with a sinus infection.  This seems consistent with flu versus COVID versus pneumonia.  Patient is COVID-positive.  His CBC is reassuring and CMP is reassuring.  I did screen patient for risk factors for PE.   Given he does have a T wave version lead III but this can be nonspecific for PE.  He denies any history of blood clots.  He is on testosterone that was started and he took his last dose on Tuesday.  He gets it weekly.  We discussed D-dimer but he can have false elevations in COVID versus PE scan.  My suspicion for PE is lower given patient is not hypoxic and this seems like the beginning onset of COVID given he just started having the fevers today.  I suspect that the tachycardia is related to the fever versus some dehydration.  We discussed pros and cons of CT imaging but patient would like to try to hold off at this time.  He is willing to get medications to try to help lower heart rates and we can reevaluate patient for need for CT imaging but at this time he wanted to try to hold off.  He understands risk factors to watch out for to return to the ER for CT imaging.  But at this time he really denies shortness of breath is more of the body aches and fevers and coughing that are bothering him.  Think this is reasonable given the symptoms have just started of COVID.  Usually if somebody has a PE with COVID it is a few days out but we did discuss holding his testosterone next week as a precaution due to increased risk for blood clots in the setting of COVID.  He expressed understanding felt comfortable with this plan.  He has no evidence of DVT on examination.  Repeat evaluation have shown that heart rates have gone down to 100.  He reports feeling much improved he continues to deny any shortness of breath he reports that his muscle aches have improved significantly.  We did discuss CT imaging again but patient would like to hold off.  He understands return precautions if he develops more shortness of breath he will hold his testosterone but at this time patient feels comfortable with discharge home after he completes the rest of his fluids.  We discussed Zofran  for any nausea staying well-hydrated, and  alternating Tylenol , ibuprofen .  Tessalon  Perles for cough.  We discussed holding testosterone.  And returning for shortness of breath.  We discussed antivirals but patient is opted to hold off.  The patient is on the cardiac monitor to evaluate for evidence of arrhythmia and/or significant heart rate  changes.      FINAL CLINICAL IMPRESSION(S) / ED DIAGNOSES   Final diagnoses:  Weakness  Dehydration  COVID-19     Rx / DC Orders   ED Discharge Orders     None        Note:  This document was prepared using Dragon voice recognition software and may include unintentional dictation errors.   Ernest Ronal BRAVO, MD 09/29/23 [phone]

## 2023-09-29 NOTE — ED Triage Notes (Addendum)
 Recent sinus infection. Finished Amoxicillin  RX Monday. Reseen by PCP on Monday for c/o chest pain.  PCP told patient that chest soreness was from couging and also was dehydrated, HR na dBP was elevated. Presents today c/o weakness and generally not feeling well.  Reports productive cough of white sputum started this morning.

## 2023-11-03 ENCOUNTER — Telehealth: Payer: Self-pay | Admitting: Allergy

## 2023-11-03 ENCOUNTER — Other Ambulatory Visit: Payer: Self-pay

## 2023-11-03 ENCOUNTER — Ambulatory Visit (INDEPENDENT_AMBULATORY_CARE_PROVIDER_SITE_OTHER): Payer: PRIVATE HEALTH INSURANCE | Admitting: Allergy

## 2023-11-03 ENCOUNTER — Encounter: Payer: Self-pay | Admitting: Allergy

## 2023-11-03 VITALS — BP 120/88 | HR 92 | Temp 98.8°F | Resp 20 | Ht 68.25 in | Wt 316.0 lb

## 2023-11-03 DIAGNOSIS — T50905D Adverse effect of unspecified drugs, medicaments and biological substances, subsequent encounter: Secondary | ICD-10-CM

## 2023-11-03 NOTE — Telephone Encounter (Signed)
 Patient has a drug challenges scheduled with Dr.Padgett on the following dates.  Augmentin Challenge at 8:30am on 10/22 with Dr.Padgett. Keflex Challenge at 8:30am on 11/21 with Dr.Padgett.  Please mail out Challenge protocol to patient,  Best Contact: [phone]

## 2023-11-03 NOTE — Progress Notes (Unsigned)
 New Patient Note  RE: Benjamin Adams MRN: [id number] DOB: 11-21-1980 Date of Office Visit: 11/03/2023  Primary care provider: Georgina Patrcia HERO, MD  Chief Complaint: Medication allergies  History of present illness: Benjamin Adams is a 43 y.o. male presenting today for evaluation of adverse medication effect.  He has a history of environmetnal allergy and was on allergy shots for a many years during childhood.  He states he did get good benefit from the allergy shots.  He has a brother and he believes his mother may have gotten him mixed up with his brother in regards to childhood medication allergies.  He would like to do an evaluation for medication allergies as he has a lengthy list of medicines in his allergy list.  He is not sure if this they all are actual allergens or if they were mistakenly added.  He reports taking Augmentin in the past and reports skin tingling, itching, burning sensation and last taken about 20 years ago.  Denies any skin sloughing, peeling or blistering. He states he can take amoxicillin  without issue however.   Keflex per EMR documentation has caused vomiting but this would have potentially occurred in childhood and he does not recall any details surrounding taking this medication.   Clindamycin per EMR documentation has SOB but he has vague memories around taking this medication and thus does not note any real details.   Sulfa drug he states he developed lip swelling and states his lip split due to the swelling.  He does not recall when this would have occurred however it is in the EMR documented in 2016 which likely indicates this was potentially a childhood administered medication. Qvar use he does recall and states caused his throght to swell and close where he had difficulty breathing or swallowing.   Albuterol is a childhood label and he does not recall what happened with use and there is no documented symptom in the EMR. Fortunately he states asthma was  a childhood issue and he has not had any respiratory concerns as an adult. Azithromycin if documented.  In the EMR in 2016 which likely indicates it is is from childhood he does not recall any details surrounding this.  There is no symptoms listed in the EMR.   Ciprofloxacin per EMR notation states makes skin feel like it is burning however he does not recall details surrounding taking this. Erythromycin per the EMR in 2016 however no symptoms documented and does not recall details surrounding taking this. Nitrofurantoin per the EMR has a notation of itching however he does not recall details. He states he is quite sensitive to pain medications and believes when he was given codeine that it was too strong of a dose and per the EMR was documented symptoms included stomach ache and unconsciousness.  Review of systems: 10pt ROS negative unless noted above in HPI  Past medical history: Past Medical History:  Diagnosis Date   Asthma    Hypoglycemia     Past surgical history: Past Surgical History:  Procedure Laterality Date   TONSILLECTOMY AND ADENOIDECTOMY     WISDOM TOOTH EXTRACTION      Family history:  Family History  Problem Relation Age of Onset   Asthma Mother    Allergic rhinitis Mother    Diabetes Mother    Hyperlipidemia Mother    Hypertension Mother    Angioedema Father    Allergic rhinitis Father    Hyperlipidemia Father    Diabetes Father  Cholecystitis Father    Asthma Brother    Allergic rhinitis Brother     Social history: Lives in a home with carpeting with gas heating and central cooling.  Dog in the home.  No concern for water damage, mildew or roaches in the home.  He is a Customer service manager.  Denies any smoking history.   Medication List: Current Outpatient Medications  Medication Sig Dispense Refill   testosterone cypionate (DEPOTESTOSTERONE CYPIONATE) 200 MG/ML injection SMARTSIG:0.5 Milliliter(s) IM Once a Week     EPINEPHrine  (EPIPEN  2-PAK)  0.3 mg/0.3 mL IJ SOAJ injection Inject 0.3 mg into the muscle as needed for anaphylaxis. 1 each 1   fluticasone  (FLONASE ) 50 MCG/ACT nasal spray Place 2 sprays into both nostrils daily. 11.1 mL 2   No current facility-administered medications for this visit.    Known medication allergies: Allergies  Allergen Reactions   Clindamycin/Lincomycin Shortness Of Breath   Albuterol    Azithromycin    Cephalexin     Other reaction(s): Vomiting   Ciprofloxacin     makes skin feel like it is burning   Clarithromycin Swelling   Clavulanic Acid Itching   Codeine     Other reaction(s): Stomach Ache, Unconsciousness   Erythromycin    Fluocinolone Itching   Nitrofurantoin Monohyd Macro Itching   Qvar  [Beclomethasone]     QVAR causes wheezing   Sulfa Antibiotics     rash     Physical examination: Blood pressure 120/88, pulse 92, temperature 98.8 F (37.1 C), resp. rate 20, height 5' 8.25 (1.734 m), weight (!) 316 lb (143.3 kg), SpO2 98%.  General: Alert, interactive, in no acute distress. HEENT: PERRLA, TMs pearly gray, turbinates non-edematous without discharge, post-pharynx non erythematous. Neck: Supple without lymphadenopathy. Lungs: Clear to auscultation without wheezing, rhonchi or rales. {no increased work of breathing. CV: Normal S1, S2 without murmurs. Abdomen: Nondistended, nontender. Skin: Warm and dry, without lesions or rashes. Extremities:  No clubbing, cyanosis or edema. Neuro:   Grossly intact.  Diagnostics/Labs: None today  Assessment and plan: Adverse effects of medication -Discussed today we we will perform graded drug challenges to medications that seem to be IgE mediated to rule out drug allergy. Graded drug challenge consists of ingesting the medication in divided doses with every hour observation time.  Plan to be in office at least 2 hours for graded drug challenge.  Hold antihistamines for 3 days prior to the challenge.  The medication will be sent to the  pharmacy to be picked up to bring to the office for challenge. Will start with Augmentin as her first graded drug challenge. Next graded drug challenge will be to Keflex.  - Discussed that some of the medications on the allergy list are likely not IgE driven and more of a delayed drug response and in these cases a graded drug challenge will not be very helpful.   Some of it the medications on the allergy list are also likely to be side effects of the medication and so a graded drug challenge would not be helpful for these medications either.   Return for graded drug challenge to Augmentin  I appreciate the opportunity to take part in Breckon's care. Please do not hesitate to contact me with questions.  Total of 45 minutes, greater than 50% of which was spent in discussion of drug allergy and graded drug challenge procedure and management options.    Sincerely,   Danita Brain, MD Allergy/Immunology [facility]

## 2023-11-04 NOTE — Telephone Encounter (Signed)
 Drug challenge forms filled

## 2023-11-05 NOTE — Patient Instructions (Addendum)
 Adverse effects of medication -Discussed today we we will perform graded drug challenges to medications that seem to be IgE mediated to rule out drug allergy. Graded drug challenge consists of ingesting the medication in divided doses with every hour observation time.  Plan to be in office at least 2 hours for graded drug challenge.  Hold antihistamines for 3 days prior to the challenge.  The medication will be sent to the pharmacy to be picked up to bring to the office for challenge. Will start with Augmentin as her first graded drug challenge. Next graded drug challenge will be to Keflex.  - Discussed that some of the medications on the allergy list are likely not IgE driven and more of a delayed drug response and in these cases a graded drug challenge will not be very helpful.   Some of it the medications on the allergy list are also likely to be side effects of the medication and so a graded drug challenge would not be helpful for these medications either.   Return for graded drug challenge to Augmentin

## 2023-11-26 ENCOUNTER — Other Ambulatory Visit: Payer: Self-pay | Admitting: Allergy

## 2023-11-26 MED ORDER — AMOXICILLIN-POT CLAVULANATE 875-125 MG PO TABS
ORAL_TABLET | ORAL | 0 refills | Status: DC
Start: 2023-11-26 — End: 2023-12-24

## 2023-11-26 NOTE — Progress Notes (Signed)
 Augmentin tab ordered for upcoming drug challenge

## 2023-12-01 ENCOUNTER — Ambulatory Visit: Payer: PRIVATE HEALTH INSURANCE | Admitting: Allergy

## 2023-12-01 ENCOUNTER — Encounter: Payer: Self-pay | Admitting: Allergy

## 2023-12-01 VITALS — BP 122/78 | HR 86 | Temp 97.9°F | Ht 70.0 in | Wt 312.0 lb

## 2023-12-01 DIAGNOSIS — Z888 Allergy status to other drugs, medicaments and biological substances status: Secondary | ICD-10-CM

## 2023-12-01 DIAGNOSIS — T50905D Adverse effect of unspecified drugs, medicaments and biological substances, subsequent encounter: Secondary | ICD-10-CM

## 2023-12-01 NOTE — Progress Notes (Signed)
 Follow-up Note  RE: ASTER ECKRICH MRN: [id number] DOB: 1980-06-23 Date of Office Visit: 12/01/2023   History of present illness: Benjamin Adams is a 43 y.o. male presenting today for drug challenge to augmentin.  He was last seen in the office on 11/03/23 by myself for adverse effect of drug.  He is in his usual state of health today without recent illness.  He has held antihistamines for at least 3 days for challenge today.   Review of systems: 10pt ROS negative unless noted above in HPI   Past medical/social/surgical/family history have been reviewed and are unchanged unless specifically indicated below.  No changes  Medication List: Current Outpatient Medications  Medication Sig Dispense Refill   testosterone cypionate (DEPOTESTOSTERONE CYPIONATE) 200 MG/ML injection SMARTSIG:0.5 Milliliter(s) IM Once a Week     amoxicillin -clavulanate (AUGMENTIN) 875-125 MG tablet Bring tabs to MD office for graded drug challenge. 3 tablet 0   EPINEPHrine  (EPIPEN  2-PAK) 0.3 mg/0.3 mL IJ SOAJ injection Inject 0.3 mg into the muscle as needed for anaphylaxis. (Patient not taking: Reported on 12/01/2023) 1 each 1   fluticasone  (FLONASE ) 50 MCG/ACT nasal spray Place 2 sprays into both nostrils daily. (Patient not taking: Reported on 12/01/2023) 11.1 mL 2   No current facility-administered medications for this visit.     Known medication allergies: Allergies  Allergen Reactions   Clindamycin/Lincomycin Shortness Of Breath   Albuterol    Azithromycin    Cephalexin     Other reaction(s): Vomiting   Ciprofloxacin     makes skin feel like it is burning   Clarithromycin Swelling   Clavulanic Acid Itching   Codeine     Other reaction(s): Stomach Ache, Unconsciousness   Erythromycin    Fluocinolone Itching   Nitrofurantoin Monohyd Macro Itching   Qvar  [Beclomethasone]     QVAR causes wheezing   Sulfa Antibiotics     rash     Physical examination: Blood pressure 122/78, pulse 86,  temperature 97.9 F (36.6 C), height 5' 10 (1.778 m), weight (!) 312 lb (141.5 kg), SpO2 98%.  General: Alert, interactive, in no acute distress. HEENT: PERRLA, TMs pearly gray, turbinates non-edematous without discharge, post-pharynx non erythematous. Neck: Supple without lymphadenopathy. Lungs: Clear to auscultation without wheezing, rhonchi or rales. {no increased work of breathing. CV: Normal S1, S2 without murmurs. Abdomen: Nondistended, nontender. Skin: Warm and dry, without lesions or rashes. Extremities:  No clubbing, cyanosis or edema. Neuro:   Grossly intact.  Diagnostics/Labs: Drug challenge to Augmentin with use of Augmentin 875mg  tab.  Benefits and risks of challenge discussed and consent obtained.  He was provided with a 1%, 10% and 90% dose of total 875mg  tab every 20 minutes.  He was observed for additional hour after completion of ingestion challenge.  He had no signs/symptoms of allergic reaction.  Vitals were obtained prior to discharge and remained stable.      Assessment and plan: Adverse effects of medication -Will perform graded drug challenges to medications that seem to be IgE mediated to rule out drug allergy. Graded drug challenge consists of ingesting the medication in divided doses with every hour observation time.  Plan to be in office at least 2 hours for graded drug challenge.  Hold antihistamines for 3 days prior to the challenge.  The medication will be sent to the pharmacy to be picked up to bring to the office for challenge.  Augmentin challenge performed today in office and was successfully passed.  You are not  allergic at this time to Augmentin or penicillin-based antibiotics.  If needed in future you can take Augmentin for appropriate infection treatment.   Will remove Augmentin (amoxicillin /clavulanic acid) from allergy list.  Next graded drug challenge will be to Keflex.  - We have discussed that some of the medications on the allergy list are  likely not IgE driven and more of a delayed drug response and in these cases a graded drug challenge will not be very helpful.   Some of the medications on the allergy list are also likely to be side effects of the medication and so a graded drug challenge would not be helpful for these medications either.   Return for graded drug challenge to Keflex.    I appreciate the opportunity to take part in Wilian's care. Please do not hesitate to contact me with questions.  Sincerely,   Danita Brain, MD Allergy/Immunology [facility]

## 2023-12-01 NOTE — Patient Instructions (Signed)
 Adverse effects of medication -Will perform graded drug challenges to medications that seem to be IgE mediated to rule out drug allergy. Graded drug challenge consists of ingesting the medication in divided doses with every hour observation time.  Plan to be in office at least 2 hours for graded drug challenge.  Hold antihistamines for 3 days prior to the challenge.  The medication will be sent to the pharmacy to be picked up to bring to the office for challenge.  Augmentin challenge performed today in office and was successfully passed.  You are not allergic at this time to Augmentin or penicillin-based antibiotics.  If needed in future you can take Augmentin for appropriate infection treatment.    Next graded drug challenge will be to Keflex.  - We have discussed that some of the medications on the allergy list are likely not IgE driven and more of a delayed drug response and in these cases a graded drug challenge will not be very helpful.   Some of the medications on the allergy list are also likely to be side effects of the medication and so a graded drug challenge would not be helpful for these medications either.   Return for graded drug challenge to Keflex.

## 2023-12-24 ENCOUNTER — Other Ambulatory Visit: Payer: Self-pay | Admitting: Allergy

## 2023-12-24 MED ORDER — CEPHALEXIN 250 MG/5ML PO SUSR: ORAL | 0 refills | Status: AC

## 2023-12-31 ENCOUNTER — Encounter: Payer: Self-pay | Admitting: Allergy

## 2023-12-31 ENCOUNTER — Ambulatory Visit (INDEPENDENT_AMBULATORY_CARE_PROVIDER_SITE_OTHER): Payer: PRIVATE HEALTH INSURANCE | Admitting: Allergy

## 2023-12-31 VITALS — BP 112/68 | HR 79 | Temp 98.1°F | Resp 18 | Wt 315.9 lb

## 2023-12-31 DIAGNOSIS — T50905D Adverse effect of unspecified drugs, medicaments and biological substances, subsequent encounter: Secondary | ICD-10-CM

## 2023-12-31 DIAGNOSIS — Z888 Allergy status to other drugs, medicaments and biological substances status: Secondary | ICD-10-CM | POA: Diagnosis not present

## 2023-12-31 NOTE — Progress Notes (Signed)
 Follow-up Note  RE: Benjamin Adams MRN: [id number] DOB: February 18, 1980 Date of Office Visit: 12/31/2023   History of present illness: Benjamin Adams is a 43 y.o. male presenting today for drug challenge to Keflex .  He was last seen in the office on 12/01/23 by myself for Amoxicillin  challenge which was successfully passed.  He is in his usual state of health today without recent illness.  He has held antihistamines for at least 3 days for challenge today.   Review of systems: 10pt ROS negative unless noted above in HPI   Past medical/social/surgical/family history have been reviewed and are unchanged unless specifically indicated below.  No changes  Medication List: Current Outpatient Medications  Medication Sig Dispense Refill   cephALEXin  (KEFLEX ) 250 MG/5ML suspension Bring to MD office for drug challenge. 20 mL 0   EPINEPHrine  (EPIPEN  2-PAK) 0.3 mg/0.3 mL IJ SOAJ injection Inject 0.3 mg into the muscle as needed for anaphylaxis. (Patient not taking: Reported on 12/01/2023) 1 each 1   fluticasone  (FLONASE ) 50 MCG/ACT nasal spray Place 2 sprays into both nostrils daily. (Patient not taking: Reported on 12/01/2023) 11.1 mL 2   testosterone cypionate (DEPOTESTOSTERONE CYPIONATE) 200 MG/ML injection SMARTSIG:0.5 Milliliter(s) IM Once a Week     No current facility-administered medications for this visit.     Known medication allergies: Allergies  Allergen Reactions   Clindamycin/Lincomycin Shortness Of Breath   Albuterol    Azithromycin    Cephalexin      Other reaction(s): Nausea/Vomiting within 20 minutes of dose.     Ciprofloxacin     makes skin feel like it is burning   Clarithromycin Swelling   Codeine     Other reaction(s): Stomach Ache, Unconsciousness   Erythromycin    Fluocinolone Itching   Nitrofurantoin Monohyd Macro Itching   Qvar  [Beclomethasone]     QVAR causes wheezing   Sulfa Antibiotics     rash     Physical examination: Blood pressure 100/70, pulse  82, temperature 98.1 F (36.7 C), temperature source Temporal, resp. rate (!) 24, weight (!) 315 lb 14.4 oz (143.3 kg), SpO2 98%.  General: Alert, interactive, in no acute distress. HEENT: PERRLA, TMs pearly gray, turbinates non-edematous without discharge, post-pharynx non erythematous. Neck: Supple without lymphadenopathy. Lungs: Clear to auscultation without wheezing, rhonchi or rales. {no increased work of breathing. CV: Normal S1, S2 without murmurs. Abdomen: Nondistended, nontender. Skin: Warm and dry, without lesions or rashes. Extremities:  No clubbing, cyanosis or edema. Neuro:   Grossly intact.  Diagnostics/Labs: Drug challenge to Keflex  250mg /36mL with total dose of 500mg .  Benefits and risks of challenge discussed and consent obtained.  He was provided with a 1% and at mark he reported nausea and difficulty swallowing but no issues breathing.  Airway patent, exam unchanged from prior.  Vitals stable.  Zofran  8mg  ODT given.  20 mg of Zyrtec also given.  He was observed for additional hour after completion of ingestion challenge.  Symptoms resolved prior discharge. Vitals were obtained prior to discharge and remained stable.    Assessment and plan:   Adverse effects of medication -Will perform graded drug challenges to medications that seem to be IgE mediated to rule out drug allergy. Graded drug challenge consists of ingesting the medication in divided doses with every hour observation time.  Plan to be in office at least 2 hours for graded drug challenge.  Hold antihistamines for 3 days prior to the challenge.  The medication will be sent to the  pharmacy to be picked up to bring to the office for challenge.  Keflex  (Cephalexin ) challenge performed today in office and was not passed with development of nausea and difficulty swallowing.  Treated with zofran  (anti-nausea medication) and challenge stopped.  You are allergic/reactive to Keflex  and continue avoidance.    Next  graded drug challenge will be to Azithromycin.  - We have discussed that some of the medications on the allergy list are likely not IgE driven and more of a delayed drug response and in these cases a graded drug challenge will not be very helpful.   Some of the medications on the allergy list are also likely to be side effects of the medication and so a graded drug challenge would not be helpful for these medications either.   Return for graded drug challenge to Azithromycin.    I appreciate the opportunity to take part in Christyan's care. Please do not hesitate to contact me with questions.  Sincerely,   Danita Brain, MD Allergy/Immunology [facility]

## 2023-12-31 NOTE — Patient Instructions (Addendum)
 Adverse effects of medication -Will perform graded drug challenges to medications that seem to be IgE mediated to rule out drug allergy. Graded drug challenge consists of ingesting the medication in divided doses with every hour observation time.  Plan to be in office at least 2 hours for graded drug challenge.  Hold antihistamines for 3 days prior to the challenge.  The medication will be sent to the pharmacy to be picked up to bring to the office for challenge.  Keflex  (Cephalexin ) challenge performed today in office and was not passed with development of nausea and difficulty swallowing.  Treated with zofran  (anti-nausea medication)  and Zyrtec and challenge stopped.  You are allergic/reactive to Keflex  and continue avoidance.    Next graded drug challenge will be to Azithromycin.  - We have discussed that some of the medications on the allergy list are likely not IgE driven and more of a delayed drug response and in these cases a graded drug challenge will not be very helpful.   Some of the medications on the allergy list are also likely to be side effects of the medication and so a graded drug challenge would not be helpful for these medications either.   Return for graded drug challenge to Azithromycin.

## 2024-03-08 ENCOUNTER — Other Ambulatory Visit: Payer: Self-pay

## 2024-03-08 ENCOUNTER — Emergency Department
Admission: EM | Admit: 2024-03-08 | Discharge: 2024-03-08 | Disposition: A | Discharge status: Home / Self Care | Payer: PRIVATE HEALTH INSURANCE

## 2024-03-08 DIAGNOSIS — J069 Acute upper respiratory infection, unspecified: Secondary | ICD-10-CM | POA: Insufficient documentation

## 2024-03-08 DIAGNOSIS — R059 Cough, unspecified: Secondary | ICD-10-CM | POA: Diagnosis present

## 2024-03-08 LAB — RESP PANEL BY RT-PCR (RSV, FLU A&B, COVID)  RVPGX2
Influenza A by PCR: NEGATIVE
Influenza B by PCR: NEGATIVE
Resp Syncytial Virus by PCR: NEGATIVE
SARS Coronavirus 2 by RT PCR: NEGATIVE

## 2024-03-08 NOTE — ED Provider Notes (Signed)
 "  [facility] Provider Note    Event Date/Time   First MD Initiated Contact with Patient 03/08/24 1406     (approximate)   History   Nasal Congestion   HPI  Benjamin Adams is a 44 y.o. male who presents today for evaluation of nasal congestion, sore throat, and cough for the past 3 to 4 days.  Reports that he was seen by his primary care provider and had a negative COVID and flu swab.  He reports that his symptoms are not improving.  He denies fevers or chills.  He denies chest pain or shortness of breath.  He has not had any abdominal pain, nausea, vomiting, or diarrhea.  Reports that he has had a hoarse voice for the past couple of days as well.  He is wondering if it is possible that he has a sinus infection.  Patient Active Problem List   Diagnosis Date Noted   Eyelid eczema, right 04/10/2021   Left cervical lymphadenopathy 04/10/2021   Migraine with aura 10/02/2014   Allergic rhinitis 10/02/2014   Adiposity 10/02/2014          Physical Exam   Triage Vital Signs: ED Triage Vitals  Encounter Vitals Group     BP 03/08/24 1301 126/78     Girls Systolic BP Percentile --      Girls Diastolic BP Percentile --      Boys Systolic BP Percentile --      Boys Diastolic BP Percentile --      Pulse Rate 03/08/24 1259 89     Resp 03/08/24 1259 20     Temp 03/08/24 1300 97.8 F (36.6 C)     Temp src --      SpO2 03/08/24 1259 97 %     Weight 03/08/24 1300 298 lb (135.2 kg)     Height 03/08/24 1300 5' 10 (1.778 m)     Head Circumference --      Peak Flow --      Pain Score 03/08/24 1300 0     Pain Loc --      Pain Education --      Exclude from Growth Chart --     Most recent vital signs: Vitals:   03/08/24 1301 03/08/24 1444  BP: 126/78   Pulse:    Resp:  20  Temp:    SpO2:      Physical Exam Vitals and nursing note reviewed.  Constitutional:      General: Awake and alert. No acute distress.    Appearance: Normal appearance. The  patient is normal weight.  HENT:     Head: Normocephalic and atraumatic.     Mouth: Mucous membranes are moist. Uvula midline.  No tonsillar exudate.  No soft palate fluctuance.  No trismus.  Mild hoarse voice, no hot potato voice.  No sublingual swelling.  No tender cervical lymphadenopathy.  No nuchal rigidity Normal TMs bilaterally, no bulging or erythematous TMs. Nasal congestion present Eyes:     General: PERRL. Normal EOMs        Right eye: No discharge.        Left eye: No discharge.     Conjunctiva/sclera: Conjunctivae normal.  Cardiovascular:     Rate and Rhythm: Normal rate and regular rhythm.     Pulses: Normal pulses.  Pulmonary:     Effort: Pulmonary effort is normal. No respiratory distress.     Breath sounds: Normal breath sounds.  Abdominal:  Abdomen is soft. There is no abdominal tenderness. No rebound or guarding. No distention. Musculoskeletal:        General: No swelling. Normal range of motion.     Cervical back: Normal range of motion and neck supple.  Skin:    General: Skin is warm and dry.     Capillary Refill: Capillary refill takes less than 2 seconds.     Findings: No rash.  Neurological:     Mental Status: The patient is awake and alert.      ED Results / Procedures / Treatments   Labs (all labs ordered are listed, but only abnormal results are displayed) Labs Reviewed  RESP PANEL BY RT-PCR (RSV, FLU A&B, COVID)  [id number]     EKG     RADIOLOGY     PROCEDURES:  Critical Care performed:   Procedures   MEDICATIONS ORDERED IN ED: Medications - No data to display   IMPRESSION / MDM / ASSESSMENT AND PLAN / ED COURSE  I reviewed the triage vital signs and the nursing notes.   Differential diagnosis includes, but is not limited to, viral URI, COVID, flu, bronchitis.  Patient is awake and alert, hemodynamically stable and afebrile.  He has a normal oxygen saturation of 97% on room air and demonstrates no increased work of  breathing.  Swab obtained in triage is negative for COVID, flu, and RSV.  Symptoms of nasal congestion, cough, and sore throat are suggestive of other viral URI.  Lungs are clear to auscultation bilaterally, no fever, normal oxygenation on room air, I do not suspect pneumonia.  Uvula is midline, no tonsillar exudate, no hot potato voice, no trismus, no neck pain, do not suspect peritonsillar or retropharyngeal abscess.  Presence of cough and lack of fever or lymphadenopathy makes strep unlikely.  TMs are clear bilaterally, no evidence of otitis media.  Do not feel that symptoms are consistent with bacterial sinusitis given that symptoms have only been ongoing for 3 to 4 days.  This was patient's concern.  We discussed timeline for this diagnosis.  We discussed symptomatic management in the meantime.  He was given a work note per request.  Patient was discharged in stable condition.   Patient's presentation is most consistent with acute complicated illness / injury requiring diagnostic workup.   FINAL CLINICAL IMPRESSION(S) / ED DIAGNOSES   Final diagnoses:  Upper respiratory tract infection, unspecified type     Rx / DC Orders   ED Discharge Orders     None        Note:  This document was prepared using Dragon voice recognition software and may include unintentional dictation errors.   Marshelle Bilger E, PA-C 03/08/24 1456    Fernand Rossie HERO, MD 03/08/24 1545  "

## 2024-03-08 NOTE — ED Triage Notes (Signed)
 Pt to ED for nasal congestion x1 week. Reports tested for flu, covid, neg last week. Reports voice keeps going in and out. NAD noted.

## 2024-03-08 NOTE — Discharge Instructions (Signed)
 Your symptoms are most consistent with viral URI at this time.  Please follow-up with your outpatient provider if your symptoms are not improving within the next 2 weeks.

## 2024-03-12 ENCOUNTER — Telehealth: Payer: Self-pay | Admitting: Family

## 2024-03-12 DIAGNOSIS — J019 Acute sinusitis, unspecified: Secondary | ICD-10-CM

## 2024-03-12 MED ORDER — AMOXICILLIN-POT CLAVULANATE 875-125 MG PO TABS: 1.0000 | ORAL_TABLET | Freq: Two times a day (BID) | ORAL | 0 refills | Status: AC

## 2024-03-12 NOTE — Progress Notes (Signed)
